# Patient Record
Sex: Female | Born: 1945 | Race: White | Hispanic: No
Health system: Midwestern US, Community
[De-identification: ages and names within clinical notes are randomized; demographics above are authoritative.]

---

## 2012-03-30 ENCOUNTER — Other Ambulatory Visit: Payer: Self-pay | Admitting: Neurosurgery

## 2012-03-30 DIAGNOSIS — M545 Low back pain: Secondary | ICD-10-CM

## 2012-03-30 DIAGNOSIS — M47817 Spondylosis without myelopathy or radiculopathy, lumbosacral region: Secondary | ICD-10-CM

## 2012-03-30 DIAGNOSIS — M4310 Spondylolisthesis, site unspecified: Secondary | ICD-10-CM

## 2012-04-04 ENCOUNTER — Ambulatory Visit
Admission: RE | Admit: 2012-04-04 | Discharge: 2012-04-04 | Disposition: A | Discharge status: Home / Self Care | Payer: Medicare Other | Source: Ambulatory Visit | Attending: Neurosurgery | Admitting: Neurosurgery

## 2012-04-04 DIAGNOSIS — M47817 Spondylosis without myelopathy or radiculopathy, lumbosacral region: Secondary | ICD-10-CM

## 2012-04-04 DIAGNOSIS — M4310 Spondylolisthesis, site unspecified: Secondary | ICD-10-CM

## 2012-04-04 DIAGNOSIS — M545 Low back pain: Secondary | ICD-10-CM

## 2012-04-20 ENCOUNTER — Other Ambulatory Visit: Payer: Self-pay

## 2017-08-28 ENCOUNTER — Other Ambulatory Visit: Payer: Self-pay | Admitting: Internal Medicine

## 2017-08-28 DIAGNOSIS — M25551 Pain in right hip: Secondary | ICD-10-CM

## 2017-08-28 DIAGNOSIS — R740 Nonspecific elevation of levels of transaminase and lactic acid dehydrogenase [LDH]: Principal | ICD-10-CM

## 2017-08-28 DIAGNOSIS — R7401 Elevation of levels of liver transaminase levels: Secondary | ICD-10-CM

## 2017-09-03 ENCOUNTER — Ambulatory Visit
Admission: RE | Admit: 2017-09-03 | Discharge: 2017-09-03 | Disposition: A | Discharge status: Home / Self Care | Payer: Medicare Other | Source: Ambulatory Visit | Attending: Internal Medicine | Admitting: Internal Medicine

## 2017-09-03 DIAGNOSIS — R7401 Elevation of levels of liver transaminase levels: Secondary | ICD-10-CM

## 2017-09-03 DIAGNOSIS — M25551 Pain in right hip: Secondary | ICD-10-CM

## 2017-09-03 DIAGNOSIS — R740 Nonspecific elevation of levels of transaminase and lactic acid dehydrogenase [LDH]: Principal | ICD-10-CM

## 2017-09-03 MED ORDER — IOPAMIDOL (ISOVUE-300) INJECTION 61%
100.0000 mL | Freq: Once | INTRAVENOUS | Status: AC | PRN
  Administered 2017-09-03: 100 mL via INTRAVENOUS

## 2017-09-04 ENCOUNTER — Other Ambulatory Visit: Payer: Medicare Other

## 2018-12-22 ENCOUNTER — Other Ambulatory Visit: Payer: Self-pay | Admitting: Internal Medicine

## 2018-12-22 DIAGNOSIS — E785 Hyperlipidemia, unspecified: Secondary | ICD-10-CM

## 2018-12-22 DIAGNOSIS — I7090 Unspecified atherosclerosis: Secondary | ICD-10-CM

## 2018-12-29 ENCOUNTER — Ambulatory Visit
Admission: RE | Admit: 2018-12-29 | Discharge: 2018-12-29 | Disposition: A | Discharge status: Home / Self Care | Payer: No Typology Code available for payment source | Source: Ambulatory Visit | Attending: Internal Medicine | Admitting: Internal Medicine

## 2018-12-29 DIAGNOSIS — I7090 Unspecified atherosclerosis: Secondary | ICD-10-CM

## 2018-12-29 DIAGNOSIS — E785 Hyperlipidemia, unspecified: Secondary | ICD-10-CM

## 2019-06-06 ENCOUNTER — Ambulatory Visit: Payer: Self-pay | Attending: Internal Medicine

## 2019-06-06 DIAGNOSIS — Z23 Encounter for immunization: Secondary | ICD-10-CM | POA: Insufficient documentation

## 2019-06-06 NOTE — Progress Notes (Signed)
   Covid-19 Vaccination Clinic  Name:  Danielle Page    MRN: [id number] DOB: [birth year]  06/06/2019  Danielle Page was observed post Covid-19 immunization for 15 minutes without incidence. She was provided with Vaccine Information Sheet and instruction to access the V-Safe system.   Danielle Page was instructed to call 911 with any severe reactions post vaccine: Marland Kitchen Difficulty breathing  . Swelling of your face and throat  . A fast heartbeat  . A bad rash all over your body  . Dizziness and weakness    Immunizations Administered    Name Date Dose VIS Date Route   Pfizer COVID-19 Vaccine 06/06/2019 10:08 AM 0.3 mL 04/01/2019 Intramuscular   Manufacturer: [employer], [employer]   Lot: [id number]   NDC: [address]-6

## 2019-06-13 ENCOUNTER — Ambulatory Visit: Payer: Medicare Other

## 2020-04-03 DIAGNOSIS — D45 Polycythemia vera: Secondary | ICD-10-CM | POA: Diagnosis not present

## 2020-04-03 DIAGNOSIS — R5381 Other malaise: Secondary | ICD-10-CM | POA: Diagnosis not present

## 2020-04-25 DIAGNOSIS — R5381 Other malaise: Secondary | ICD-10-CM | POA: Diagnosis not present

## 2020-04-25 DIAGNOSIS — D45 Polycythemia vera: Secondary | ICD-10-CM | POA: Diagnosis not present

## 2020-04-25 DIAGNOSIS — R5383 Other fatigue: Secondary | ICD-10-CM | POA: Diagnosis not present

## 2020-04-25 DIAGNOSIS — E559 Vitamin D deficiency, unspecified: Secondary | ICD-10-CM | POA: Diagnosis not present

## 2020-04-25 DIAGNOSIS — R531 Weakness: Secondary | ICD-10-CM | POA: Diagnosis not present

## 2020-05-29 DIAGNOSIS — R6881 Early satiety: Secondary | ICD-10-CM | POA: Diagnosis not present

## 2020-05-29 DIAGNOSIS — R634 Abnormal weight loss: Secondary | ICD-10-CM | POA: Diagnosis not present

## 2020-05-29 DIAGNOSIS — R439 Unspecified disturbances of smell and taste: Secondary | ICD-10-CM | POA: Diagnosis not present

## 2020-05-29 DIAGNOSIS — D45 Polycythemia vera: Secondary | ICD-10-CM | POA: Diagnosis not present

## 2020-05-30 ENCOUNTER — Other Ambulatory Visit (HOSPITAL_BASED_OUTPATIENT_CLINIC_OR_DEPARTMENT_OTHER): Payer: Self-pay | Admitting: Internal Medicine

## 2020-05-30 DIAGNOSIS — M65331 Trigger finger, right middle finger: Secondary | ICD-10-CM | POA: Diagnosis not present

## 2020-05-30 DIAGNOSIS — R634 Abnormal weight loss: Secondary | ICD-10-CM

## 2020-05-31 ENCOUNTER — Other Ambulatory Visit: Payer: Self-pay

## 2020-05-31 ENCOUNTER — Encounter (HOSPITAL_BASED_OUTPATIENT_CLINIC_OR_DEPARTMENT_OTHER): Payer: Self-pay

## 2020-05-31 ENCOUNTER — Ambulatory Visit (HOSPITAL_BASED_OUTPATIENT_CLINIC_OR_DEPARTMENT_OTHER)
Admission: RE | Admit: 2020-05-31 | Discharge: 2020-05-31 | Disposition: A | Discharge status: Home / Self Care | Payer: Medicare PPO | Source: Ambulatory Visit | Attending: Internal Medicine | Admitting: Internal Medicine

## 2020-05-31 DIAGNOSIS — C50911 Malignant neoplasm of unspecified site of right female breast: Secondary | ICD-10-CM | POA: Diagnosis not present

## 2020-05-31 DIAGNOSIS — K449 Diaphragmatic hernia without obstruction or gangrene: Secondary | ICD-10-CM | POA: Diagnosis not present

## 2020-05-31 DIAGNOSIS — M4316 Spondylolisthesis, lumbar region: Secondary | ICD-10-CM | POA: Diagnosis not present

## 2020-05-31 DIAGNOSIS — N8110 Cystocele, unspecified: Secondary | ICD-10-CM | POA: Diagnosis not present

## 2020-05-31 DIAGNOSIS — I700 Atherosclerosis of aorta: Secondary | ICD-10-CM | POA: Diagnosis not present

## 2020-05-31 DIAGNOSIS — R634 Abnormal weight loss: Secondary | ICD-10-CM | POA: Insufficient documentation

## 2020-05-31 DIAGNOSIS — E785 Hyperlipidemia, unspecified: Secondary | ICD-10-CM | POA: Diagnosis not present

## 2020-05-31 MED ORDER — IOHEXOL 300 MG/ML  SOLN
100.0000 mL | Freq: Once | INTRAMUSCULAR | Status: AC | PRN
  Administered 2020-05-31: 100 mL via INTRAVENOUS

## 2020-06-01 DIAGNOSIS — H353124 Nonexudative age-related macular degeneration, left eye, advanced atrophic with subfoveal involvement: Secondary | ICD-10-CM | POA: Diagnosis not present

## 2020-06-01 DIAGNOSIS — H353113 Nonexudative age-related macular degeneration, right eye, advanced atrophic without subfoveal involvement: Secondary | ICD-10-CM | POA: Diagnosis not present

## 2020-06-01 DIAGNOSIS — H269 Unspecified cataract: Secondary | ICD-10-CM | POA: Diagnosis not present

## 2020-06-01 DIAGNOSIS — H353221 Exudative age-related macular degeneration, left eye, with active choroidal neovascularization: Secondary | ICD-10-CM | POA: Diagnosis not present

## 2020-06-04 DIAGNOSIS — E876 Hypokalemia: Secondary | ICD-10-CM | POA: Diagnosis not present

## 2020-06-04 DIAGNOSIS — D61818 Other pancytopenia: Secondary | ICD-10-CM | POA: Diagnosis not present

## 2020-06-04 DIAGNOSIS — Z853 Personal history of malignant neoplasm of breast: Secondary | ICD-10-CM | POA: Diagnosis not present

## 2020-06-04 DIAGNOSIS — J849 Interstitial pulmonary disease, unspecified: Secondary | ICD-10-CM | POA: Diagnosis not present

## 2020-06-04 DIAGNOSIS — D6182 Myelophthisis: Secondary | ICD-10-CM | POA: Diagnosis not present

## 2020-06-04 DIAGNOSIS — L03114 Cellulitis of left upper limb: Secondary | ICD-10-CM | POA: Diagnosis not present

## 2020-06-04 DIAGNOSIS — D849 Immunodeficiency, unspecified: Secondary | ICD-10-CM | POA: Diagnosis not present

## 2020-06-04 DIAGNOSIS — I8290 Acute embolism and thrombosis of unspecified vein: Secondary | ICD-10-CM | POA: Diagnosis not present

## 2020-06-04 DIAGNOSIS — R21 Rash and other nonspecific skin eruption: Secondary | ICD-10-CM | POA: Diagnosis not present

## 2020-06-04 DIAGNOSIS — D709 Neutropenia, unspecified: Secondary | ICD-10-CM | POA: Diagnosis not present

## 2020-06-04 DIAGNOSIS — R918 Other nonspecific abnormal finding of lung field: Secondary | ICD-10-CM | POA: Diagnosis not present

## 2020-06-04 DIAGNOSIS — D696 Thrombocytopenia, unspecified: Secondary | ICD-10-CM | POA: Diagnosis not present

## 2020-06-04 DIAGNOSIS — Z8673 Personal history of transient ischemic attack (TIA), and cerebral infarction without residual deficits: Secondary | ICD-10-CM | POA: Diagnosis not present

## 2020-06-04 DIAGNOSIS — M79602 Pain in left arm: Secondary | ICD-10-CM | POA: Diagnosis not present

## 2020-06-04 DIAGNOSIS — M7989 Other specified soft tissue disorders: Secondary | ICD-10-CM | POA: Diagnosis not present

## 2020-06-04 DIAGNOSIS — E883 Tumor lysis syndrome: Secondary | ICD-10-CM | POA: Diagnosis not present

## 2020-06-04 DIAGNOSIS — B259 Cytomegaloviral disease, unspecified: Secondary | ICD-10-CM | POA: Diagnosis not present

## 2020-06-04 DIAGNOSIS — I82612 Acute embolism and thrombosis of superficial veins of left upper extremity: Secondary | ICD-10-CM | POA: Diagnosis not present

## 2020-06-04 DIAGNOSIS — C92A0 Acute myeloid leukemia with multilineage dysplasia, not having achieved remission: Secondary | ICD-10-CM | POA: Diagnosis not present

## 2020-06-04 DIAGNOSIS — D649 Anemia, unspecified: Secondary | ICD-10-CM | POA: Diagnosis not present

## 2020-06-04 DIAGNOSIS — B49 Unspecified mycosis: Secondary | ICD-10-CM | POA: Diagnosis not present

## 2020-06-04 DIAGNOSIS — C9440 Acute panmyelosis with myelofibrosis not having achieved remission: Secondary | ICD-10-CM | POA: Diagnosis not present

## 2020-06-04 DIAGNOSIS — I82A19 Acute embolism and thrombosis of unspecified axillary vein: Secondary | ICD-10-CM | POA: Diagnosis not present

## 2020-06-04 DIAGNOSIS — R5081 Fever presenting with conditions classified elsewhere: Secondary | ICD-10-CM | POA: Diagnosis not present

## 2020-06-04 DIAGNOSIS — M79604 Pain in right leg: Secondary | ICD-10-CM | POA: Diagnosis not present

## 2020-06-04 DIAGNOSIS — D45 Polycythemia vera: Secondary | ICD-10-CM | POA: Diagnosis not present

## 2020-06-04 DIAGNOSIS — D6869 Other thrombophilia: Secondary | ICD-10-CM | POA: Diagnosis not present

## 2020-06-04 DIAGNOSIS — D61810 Antineoplastic chemotherapy induced pancytopenia: Secondary | ICD-10-CM | POA: Diagnosis not present

## 2020-06-04 DIAGNOSIS — R509 Fever, unspecified: Secondary | ICD-10-CM | POA: Diagnosis not present

## 2020-06-04 DIAGNOSIS — Z856 Personal history of leukemia: Secondary | ICD-10-CM | POA: Diagnosis not present

## 2020-06-04 DIAGNOSIS — J9811 Atelectasis: Secondary | ICD-10-CM | POA: Diagnosis not present

## 2020-06-04 DIAGNOSIS — T82868A Thrombosis of vascular prosthetic devices, implants and grafts, initial encounter: Secondary | ICD-10-CM | POA: Diagnosis not present

## 2020-06-04 DIAGNOSIS — L270 Generalized skin eruption due to drugs and medicaments taken internally: Secondary | ICD-10-CM | POA: Diagnosis not present

## 2020-06-04 DIAGNOSIS — G40909 Epilepsy, unspecified, not intractable, without status epilepticus: Secondary | ICD-10-CM | POA: Diagnosis not present

## 2020-06-04 DIAGNOSIS — C9200 Acute myeloblastic leukemia, not having achieved remission: Secondary | ICD-10-CM | POA: Diagnosis not present

## 2020-06-04 DIAGNOSIS — R600 Localized edema: Secondary | ICD-10-CM | POA: Diagnosis not present

## 2020-06-04 LAB — COVID-19: SARS-CoV-2: NOT DETECTED

## 2020-06-04 NOTE — H&P (Signed)
History and Physical               [facility]  Lauren Madden., MD  Admission Date: 06/04/2020    ADMISSION DIAGNOSES:  1.  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  2.  History of polycythemia vera.  3.  Pancytopenia.  4.  Low-grade fever.    HISTORY OF PRESENT ILLNESS:  This is a 75 year old woman who was  evaluated in and admitted directly from the office today  for further management of suspected acute leukemia.  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  She has been maintained on  hydroxyurea since that time with excellent disease control.  They  spend half of the year in [place].  Last week routine followup  CBC obtained locally by her primary care physician revealed  thrombocytopenia.  As this was not anticipated, an immediate redraw  was recommended and confirmed thrombocytopenia at 66,000.  We  recommended that the patient seek a hematologist locally in [place] for fear that she had developed a new hematological process.   However, the patient related that her primary care physician did not  feel that there was a competent hematologist nearby and recommended  that she return to [place].  She therefore was seen by me in the  office today.  She relates that over the past 3 weeks she has had  increasing generalized weakness and fatigue.  She has had loss of  appetite and has lost some 10 pounds over the past 3 months.  She  transiently lost her taste for some foods, but this has since  recovered.  She was seen by her primary care physician in [place].  A COVID test was performed and was negative.  CT scans of  the chest, abdomen and pelvis were performed at [place], [place] and were reportedly normal including the absence of  splenomegaly.  She relates a low-grade fever over the past 3 days to 100  degrees.  She has had mild sweats, but no drenching sweats over the  past 7 days with occasional  chills.  She has had watery stools once or  twice a day off and on for the past few weeks, but worse over the past  few days.  Of note, she has been taking metaxalone over the past 3  months, which has a notable risk of hemolytic anemia and leukopenia.   She was instructed to discontinue Hydrea immediately upon notification  of the thrombocytopenia.  At that time she also discontinued the 2  baby aspirin that she took each day.  She related no melena or  hematochezia.    Review of the peripheral blood smear is very worrisome, revealing  a number of atypical large mononuclear cells with nucleoli and scant  amounts of deep blue cytoplasm consistent with blasts.  No Auer rods are seen.  Occasional  nucleated red blood cells are evident.  Given the leucoerythroblastic  picture on the peripheral blood smear, a bone marrow aspirate and  biopsy were performed in the office prior to admission for further  investigation and treatment.    PRIOR MEDICAL HISTORY:  Otherwise noteworthy for pernicious anemia,  seizure disorder, vitamin D deficiency, stroke as above, presentation  with P. vera in 2010, history of pulmonary embolus, complicated breast  surgery in 2009.  She had very early stage right breast cancer excised  in 2009 via  lumpectomy and treated with MammoSite radiation without  evidence of recurrence.    PAST SURGICAL HISTORY:  Includes parathyroidectomy in 2006, history of  breast biopsy in 2003 revealing benign changes.  She underwent  oophorectomy, rectocele and cystocele repair in 1995.    ADMISSION MEDICATIONS:  Include hydroxyurea, on hold as of last week.   Dilantin 100 mg t.i.d., potassium 10 mEq daily, B12 1000 mcg IM  monthly, Dexilant 60 mg daily, VESIcare 5 mg daily, vitamin D3 50,000  units weekly and metaxalone 800 mg daily.    ALLERGIES:  IV DYE, SHELLFISH, WASP VENOM, HONEY BEE VENOM.    SOCIAL HISTORY:  She is married.  She is a retired [employer].  She is a never smoker.  She does not drink  alcohol.    FAMILY HISTORY:  Noncontributory.    REVIEW OF SYSTEMS:  As above.    PHYSICAL EXAMINATION:  She is alert.  Temperature is 100.2 degrees in  the office.  Pulse 100, blood pressure 101/71.  GENERAL:  She appears  fatigued and moderately ill.  She is alert and lucid.  HEENT:  Reveals  no icterus.  There is moderate pallor.  There are no focal oral  lesions.  There is no gingival margin abnormality.  NECK:  Supple.   There is no palpable peripheral lymphadenopathy.  CHEST:  Lung fields  are clear.  CARDIAC:  Regular, without murmur, rub or gallop.   ABDOMEN:  Nondistended, soft and nontender.  I cannot appreciate  organomegaly.  There is no evidence of ascites.  EXTREMITIES:  There  is no dependent edema.  SKIN:  No skin rash is noted.  She is  generally weak, but can mount the exam table with limited assistance.    LABORATORY DATA:  White count of 6200, hemoglobin is 8.1, platelet  count 49,000.  Of interest, the hemoglobin on January 5th was 13.4, at  which time the platelet count was 246,000.  Chemistries reveal sodium  132, BUN 17, creatinine 0.6, SGOT 53, SGPT 26, LDH is 4953.   Reticulocytopenia is evident at 37,000.  Peripheral blood smear review  as above.    IMPRESSION:  A 75 year old with longstanding history of polycythemia  vera well controlled on relatively low-dose hydroxyurea presents with  a recent onset of thrombocytopenia, anemia and a multitude of constitutional  symptoms of concern.    Review of the peripheral blood smear is very worrisome for the  development of acute leukemia.  No Auer rods are seen and thus it is  unclear whether this represents a myeloid or lymphoid histology.  A  bone marrow aspiration and biopsy was originally performed in the  office.  She is admitted for further investigation and treatment.  I  anticipate the need for induction chemotherapy.  Prognosis is quite  guarded.  The situation was discussed in detail with the patient and  her husband.      Phineas Douglas,  Montez Hageman., MD  TR: dn DD: 06/04/2020 20:02 TD: 06/04/2020 20:34 Job#: [id number]  \\[id number]\\DOC#: [id number]  \\[id number]\\    Signature Line     Electronically Signed on 06/05/2020 07:29 AM EST   ________________________________________________   Lauren Madden FREDERICK-MD               Modified by: Lauren Madden FREDERICK-MD on 06/05/2020 07:29 AM EST

## 2020-06-04 NOTE — Nursing Note (Signed)
Adult Patient History Form-Text       Adult Patient History Entered On:  06/04/2020 15:50 EST    Performed On:  06/04/2020 15:27 EST by PLUKAS, RN, JESSICA               General Info   Patient Identified :   Identification band, Verbal   Information Given By :   Self, Spouse   Accompanied By :   Spouse   In Charge of News (ICON) Name :   Lauren Madden   Pregnancy Status :   N/A   Has the patient received chemotherapy or immunotherapy (cytotoxic)  in the last 48-72 hours? :   No   In Clinical Trial With Signed Consent for Related Condition :   N/A   Is the patient currently (2-3 days) receiving radiation treatment? :   No   PLUKAS, RN, JESSICA - 06/04/2020 15:27 EST   Allergies   (As Of: 06/04/2020 15:50:34 EST)   Allergies (Active)   Betadine  Estimated Onset Date:   Unspecified ; Reactions:   Swelling ; Created By:   Cassell Clement, RN, JESSICA; Reaction Status:   Active ; Category:   Drug ; Substance:   Betadine ; Type:   Allergy ; Severity:   Severe ; Updated By:   Cassell Clement, RN, JESSICA; Reviewed Date:   06/04/2020 15:30 EST      Seafood  Estimated Onset Date:   Unspecified ; Reactions:   Swelling ; Created By:   Cassell Clement, RN, JESSICA; Reaction Status:   Active ; Category:   Drug ; Substance:   Seafood ; Type:   Allergy ; Severity:   Severe ; Updated By:   Cassell Clement, RN, JESSICA; Reviewed Date:   06/04/2020 15:31 EST        Medication History   Medication List   (As Of: 06/04/2020 15:50:34 EST)   Home Meds    albuterol  :   albuterol ; Status:   Documented ; Ordered As Mnemonic:   albuterol ; Simple Display Line:   4 mg, NEB, q6hr, PRN: shortness of breath/wheezing, 0 Refill(s) ; Catalog Code:   albuterol ; Order Dt/Tm:   06/04/2020 15:46:06 EST          ascorbic acid  :   ascorbic acid ; Status:   Documented ; Ordered As Mnemonic:   Ester-C 1000 mg oral tablet ; Simple Display Line:   1,000 mg, 1 tabs, Oral, Daily, 30 tabs, 0 Refill(s) ; Catalog Code:   ascorbic acid ; Order Dt/Tm:   06/04/2020 15:45:10 EST          cyanocobalamin  :    cyanocobalamin ; Status:   Documented ; Ordered As Mnemonic:   Vitamin B-12 (cyanocobalamin) 1000 mcg/mL injectable solution ; Simple Display Line:   1,000 mcg, 1 mL, IM, qMonth, 10 mL, 0 Refill(s) ; Catalog Code:   cyanocobalamin ; Order Dt/Tm:   06/04/2020 15:47:59 EST          dexlansoprazole  :   dexlansoprazole ; Status:   Documented ; Ordered As Mnemonic:   Dexilant 60 mg oral delayed release capsule ; Simple Display Line:   60 mg, 1 caps, Oral, Daily, 30 caps, 0 Refill(s) ; Catalog Code:   dexlansoprazole ; Order Dt/Tm:   06/04/2020 15:47:59 EST          ergocalciferol  :   ergocalciferol ; Status:   Documented ; Ordered As Mnemonic:   ergocalciferol 50,000 intl units (1.25 mg) oral capsule ; Simple  Display Line:   1 caps, Oral, qWeek, 24 caps, 0 Refill(s) ; Catalog Code:   ergocalciferol ; Order Dt/Tm:   06/04/2020 15:47:59 EST          phenyTOIN  :   phenyTOIN ; Status:   Documented ; Ordered As Mnemonic:   Dilantin 100 mg oral capsule, extended release ; Simple Display Line:   200 mg, 2 caps, Oral, Once a day (at bedtime), 270 cap, 0 Refill(s) ; Catalog Code:   phenyTOIN ; Order Dt/Tm:   06/04/2020 15:47:59 EST          solifenacin  :   solifenacin ; Status:   Documented ; Ordered As Mnemonic:   solifenacin 5 mg oral tablet ; Simple Display Line:   5 mg, 1 tabs, Oral, Daily, 30 tabs, 0 Refill(s) ; Catalog Code:   solifenacin ; Order Dt/Tm:   06/04/2020 15:47:59 EST            Problem History   (As Of: 06/04/2020 15:50:34 EST)   Problems(Active)    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 )  Name of Problem:   Absent parathyroid gland ; Recorder:   PLUKAS, RN, JESSICA; Confirmation:   Confirmed ; Classification:   Patient Stated ; Code:   979892119 ; Contributor System:   Dietitian ; Last Updated:   06/04/2020 15:38 EST ; Life Cycle Date:   06/04/2020 ; Life Cycle Status:   Active ; Vocabulary:   SNOMED CT        Acid reflux (SNOMED CT  :417408144 )  Name of Problem:   Acid reflux ; Recorder:   PLUKAS, RN,  JESSICA; Confirmation:   Confirmed ; Classification:   Patient Stated ; Code:   818563149 ; Contributor System:   PowerChart ; Last Updated:   06/04/2020 15:38 EST ; Life Cycle Date:   06/04/2020 ; Life Cycle Status:   Active ; Vocabulary:   SNOMED CT        Acute cerebrovascular accident (CVA) (SNOMED CT  :702637858 )  Name of Problem:   Acute cerebrovascular accident (CVA) ; Recorder:   PLUKAS, RN, JESSICA; Confirmation:   Confirmed ; Classification:   Patient Stated ; Code:   850277412 ; Contributor System:   Dietitian ; Last Updated:   06/04/2020 15:32 EST ; Life Cycle Date:   06/04/2020 ; Life Cycle Status:   Active ; Vocabulary:   SNOMED CT        Breast CA (SNOMED CT  :878676720 )  Name of Problem:   Breast CA ; Recorder:   PLUKAS, RN, JESSICA; Confirmation:   Confirmed ; Classification:   Patient Stated ; Code:   947096283 ; Contributor System:   PowerChart ; Last Updated:   06/04/2020 15:34 EST ; Life Cycle Date:   06/04/2020 ; Life Cycle Status:   Active ; Vocabulary:   SNOMED CT        Polycythemia vera (SNOMED CT  :662947654 )  Name of Problem:   Polycythemia vera ; Recorder:   PLUKAS, RN, JESSICA; Confirmation:   Confirmed ; Classification:   Patient Stated ; Code:   650354656 ; Contributor System:   PowerChart ; Last Updated:   06/04/2020 15:32 EST ; Life Cycle Date:   06/04/2020 ; Life Cycle Status:   Active ; Vocabulary:   SNOMED CT        Pulmonary embolism (SNOMED CT  :81275170 )  Name of Problem:   Pulmonary embolism ; Recorder:   PLUKAS, RN, JESSICA; Confirmation:  Confirmed ; Classification:   Patient Stated ; Code:   [id number] ; Contributor System:   PowerChart ; Last Updated:   06/04/2020 15:36 EST ; Life Cycle Date:   06/04/2020 ; Life Cycle Status:   Active ; Vocabulary:   SNOMED CT        Shingles (SNOMED CT  :62130868902018 )  Name of Problem:   Shingles ; Recorder:   PLUKAS, RN, JESSICA; Confirmation:   Confirmed ; Classification:   Patient Stated ; Code:   5784696:   8902018 ; Contributor System:   DietitianowerChart ;  Last Updated:   06/04/2020 15:37 EST ; Life Cycle Date:   06/04/2020 ; Life Cycle Status:   Active ; Vocabulary:   SNOMED CT          Procedure History        -    Procedure History   (As Of: 06/04/2020 15:50:34 EST)     Anesthesia Minutes:   0 ; Procedure Name:   Lumpectomy of breast ; Procedure Minutes:   0 ; Last Reviewed Dt/Tm:   06/04/2020 15:50:32 EST            Anesthesia Minutes:   0 ; Procedure Name:   Hysterectomy ; Procedure Minutes:   0 ; Last Reviewed Dt/Tm:   06/04/2020 15:50:32 EST            Immunizations   Influenza Vaccine Status :   Received prior to admission, during current flu season   PLUKAS, RN, JESSICA - 06/04/2020 15:27 EST   ID Risk Screen Symptoms   Recent Travel History :   No recent travel   Close Contact with COVID-19 ID :   No   Last 14 days COVID-19 ID :   Yes - Not Detected (negative)   TB Symptom Screen :   No symptoms   C. diff Symptom/History ID :   Neither of the above   MRSA/VRE Screening :   Admitted to an Critical Care or BMT   PLUKAS, RN, JESSICA - 06/04/2020 15:27 EST   Bloodless Medicine   Is Blood Transfusion Acceptable to Patient :   Yes   PLUKAS, RN, JESSICA - 06/04/2020 15:27 EST   Nutrition   Nutrition Screen for Malnutrition :   Decrease in appetite   PLUKAS, RN, JESSICA - 06/04/2020 15:27 EST   Malnutrition Screening Tool   MST Have You Recently Lost Weight Without Trying? :   No   MST Weight Loss Score :   0    MST Have You Been Eating Poorly? :   Yes   MST Appetite Score :   1    MST Score :   1    MST Interpretation :   Not at risk   PLUKAS, RN, JESSICA - 06/04/2020 15:27 EST   Functional   Sensory Deficits :   Other: macular degeneration   ADLs Prior to Admission :   Independent   PLUKAS, RN, JESSICA - 06/04/2020 15:27 EST   Social History   Social History   (As Of: 06/04/2020 15:50:34 EST)   Tobacco:        Tobacco use: Never (less than 100 in lifetime).   (Last Updated: 06/04/2020 15:41:52 EST by Cassell ClementPLUKAS, RN, JESSICA)          Alcohol:        Denies   (Last Updated:  06/04/2020 15:41:52 EST by Cassell ClementPLUKAS, RN, JESSICA)          Substance Use:  Opioid Naive - not currently taking opioids   (Last Updated: 06/04/2020 15:41:52 EST by Cassell Clement, RN, JESSICA)            Spiritual   Do you have a concern that you would like to address with a Chaplain? :   No   Do you have any religious/spiritual/cultural beliefs that could impact the way your care is provided? :   No   PLUKAS, RN, JESSICA - 06/04/2020 15:27 EST   Harm Screen   Suspect or Concern for: :   None   Feels Safe Where Live :   Yes   Last 3 mo, thoughts killing self/others :   Patient denies   PLUKAS, RN, JESSICA - 06/04/2020 15:27 EST   Advance Directive   Location of Advance Directive :   Family to bring in copy from home   Advance Directive :   Yes   Type of Advance Directive :   Living will, Medical durable power of attorney   Patient Wishes to Receive Further Information on Advance Directives :   No   PLUKAS, RN, JESSICA - 06/04/2020 15:27 EST   Education   Caregiver/Advocate Language   Patient :   None, Demonstration   Family :   Demonstration, None   PLUKAS, RN, JESSICA - 06/04/2020 15:27 EST   Teaching Method :   Demonstration, Explanation   PLUKAS, RN, JESSICA - 06/04/2020 15:27 EST   Preventative Measures Information   Unit/Room Orientation :   Verbalizes understanding, Demonstrates   Environmental Safety :   Verbalizes understanding, Demonstrates   Hand Washing :   Verbalizes understanding, Demonstrates   Infection Prevention :   Verbalizes understanding, Demonstrates   DVT Prophylaxis :   Verbalizes understanding, Demonstrates   Isolation Precaution :   Verbalizes understanding, Demonstrates   PLUKAS, RN, JESSICA - 06/04/2020 15:27 EST   DC Needs   CM Living Situation :   Home with no services   Home Caregiver Name/Relationship :   Lauren Madden   Current Living Situation :   [phone]   Anticipated Discharge Needs :   Home health   Cassell Clement, RN, JESSICA - 06/04/2020 15:27 EST   Valuables and Belongings   Does Patient Have Valuables  and Belongings :   Yes   PLUKAS, RN, JESSICA - 06/04/2020 15:27 EST   Valuables and Belongings   At Bedside :   Cell phone   PLUKAS, RN, JESSICA - 06/04/2020 15:27 EST   Admission Complete   Admission Complete :   Yes   PLUKAS, RN, JESSICA - 06/04/2020 15:27 EST

## 2020-06-05 LAB — DIFFERENTIAL, MANUAL
Absolute Bands #: 0.1 10*3/uL
Absolute Lymph #: 1 10*3/uL (ref 1.0–3.2)
Absolute Mono #: 0.6 10*3/uL (ref 0.3–1.0)
Bands Relative: 2 % (ref 0–5)
Eosinophils %: 1 % (ref 0–7)
Lymphocytes: 21 % (ref 15–45)
Lymphs, Absolute Count, Reactive: 0.6 10*3/uL
Metamyelocytes Absolute: 0.1 10*3/uL
Metamyelocytes Relative: 2 %
Monocytes: 12 % (ref 4–12)
Myelocyte Percent: 1 %
Neutrophils %. Manual count: 48 % (ref 42–74)
Neutrophils Absolute: 2.3 10*3/uL (ref 1.6–7.3)
Nucleated RBCs: 2 100WBC — ABNORMAL HIGH (ref 0–0)
Platelet Estimate: DECREASED — AB
RBC Morphology: ABNORMAL — AB
Reactive Lymphocytes: 13 %

## 2020-06-05 LAB — PROTIME-INR
INR: 1.4 — ABNORMAL LOW (ref 1.5–3.5)
Protime: 17.4 seconds — ABNORMAL HIGH (ref 11.6–14.5)

## 2020-06-05 LAB — COMPREHENSIVE METABOLIC PANEL
ALT: 17 U/L (ref 0–33)
AST: 28 U/L (ref 0–32)
Albumin/Globulin Ratio: 1.5 mmol/L (ref 1.00–2.70)
Albumin: 3.4 g/dL — ABNORMAL LOW (ref 3.5–5.2)
Alk Phosphatase: 73 U/L (ref 35–117)
Anion Gap: 12 mmol/L (ref 2–17)
BUN: 10 mg/dL (ref 8–23)
CALCIUM,CORRECTED,CCA: 8.9 mg/dL (ref 8.8–10.2)
CO2: 22 mmol/L (ref 22–29)
Calcium: 8.4 mg/dL — ABNORMAL LOW (ref 8.8–10.2)
Chloride: 100 mmol/L (ref 98–107)
Creatinine: 0.5 mg/dL (ref 0.5–1.0)
GFR African American: 110 mL/min/{1.73_m2} (ref >=90)
GFR Non-African American: 95 mL/min/{1.73_m2} (ref >=90)
Globulin: 2.3 g/dL (ref 1.9–4.4)
Glucose: 109 mg/dL — ABNORMAL HIGH (ref 70–99)
OSMOLALITY CALCULATED: 268 mOsm/kg — ABNORMAL LOW (ref 270–287)
Potassium: 3.4 mmol/L — ABNORMAL LOW (ref 3.5–5.3)
Sodium: 134 mmol/L — ABNORMAL LOW (ref 135–145)
Total Bilirubin: 0.69 mg/dL (ref 0.00–1.20)
Total Protein: 5.7 g/dL — ABNORMAL LOW (ref 6.4–8.3)

## 2020-06-05 LAB — CBC WITH AUTO DIFFERENTIAL
Hematocrit: 22.2 % — ABNORMAL LOW (ref 34.0–47.0)
Hemoglobin: 7.6 g/dL — ABNORMAL LOW (ref 11.5–15.7)
MCH: 40.2 pg — ABNORMAL HIGH (ref 27.0–34.5)
MCHC: 34.2 g/dL (ref 32.0–36.0)
MCV: 117.5 fL — ABNORMAL HIGH (ref 81.0–99.0)
MPV: 11.3 fL (ref 7.2–13.2)
NRBC Absolute: 0.21 10*3/uL — ABNORMAL HIGH (ref 0.000–0.012)
NRBC Automated: 4.5 % — ABNORMAL HIGH (ref 0.0–0.2)
Platelets: 44 10*3/uL — ABNORMAL LOW (ref 140–440)
RBC: 1.89 x10e6/mcL — ABNORMAL LOW (ref 3.60–5.20)
RDW: 14.1 % (ref 11.0–16.0)
WBC: 4.7 10*3/uL (ref 3.8–10.6)

## 2020-06-05 LAB — IMMATURE CELLS
IMMATURE PLT ABSOLUTE: 7.4 10*3/uL
IMMATURE PLT PERCENT: 16.8 % — ABNORMAL HIGH (ref 1.2–8.6)

## 2020-06-05 LAB — HBSAG: Hepatitis B Surface Ag: NONREACTIVE

## 2020-06-05 LAB — PTT: PTT: 29 seconds (ref 23.3–34.5)

## 2020-06-05 LAB — URIC ACID: Uric Acid: 4.2 mg/dL (ref 2.4–5.7)

## 2020-06-05 NOTE — Nursing Note (Signed)
Vascular Access Nurse Note               ULTRASOUND GUIDED PICC LINE PLACEMENT    INDICATION: CHEMO    PROCEDURE PERFORMED BY:  Lavon Paganini, RN    PROCEDURE:    After informed, written consent, A preprocedure timeout was performed.  Arm circumference at insertion site was 25cm.  The patient's LEFT arm was prepped and draped in sterile fashion.   Local anesthesia was obtained with 58mL of 1% lidocaine.  Using ultrasound guidance, the LEFT BASILIC vein was accessed using micropuncture technique x1 attempt.  A guide wire was introduced into the needle.  The needle was removed, a 6 French peel-away sheath was placed over the wire and then the wire was removed.  A 42 cm, TRIPLE lumen catheter (ARROW lot# [id number]) was advanced through the peel-away sheath and the sheath was removed.   The tip of the catheter terminates in the SVC/CAJ per vascular positioning system, utilizing the Teleflex guide wire.   The catheter functions well and is ready for use. A biopatch was placed around the catheter and was secured to the skin with a stat lock.   A sterile dressing was applied.  The catheter was flushed with 8mL of sterile normal saline.  Patient tolerated procedure well.  No Complications.    PROCEDURE START TIME: 0850  PROCEDURE END TIME:  0910    Signature Line     Addendum by Geralyn Flash on June 05, 2020 9:13 EST               BIOPATCH WAS SATURATED WHILE PLACING WITH BLOOD OOZING FROM SITE. PLACED DRY STERILE GAUZE AND TEGEDERM.  WITH A PRESSURE DRESSING.  NOTIFIED ALEX, RN.

## 2020-06-06 LAB — DIFFERENTIAL, MANUAL
Absolute Bands #: 0.2 10*3/uL
Absolute Eos #: 0.1 10*3/uL (ref 0.0–0.5)
Absolute Lymph #: 0.8 10*3/uL — ABNORMAL LOW (ref 1.0–3.2)
Absolute Mono #: 0.6 10*3/uL (ref 0.3–1.0)
Bands Relative: 4 % (ref 0–5)
Eosinophils %: 2 % (ref 0–7)
Lymphocytes: 16 % (ref 15–45)
Lymphs, Absolute Count, Reactive: 0.8 10*3/uL
Metamyelocytes Absolute: 0.1 10*3/uL
Metamyelocytes Relative: 1 %
Monocytes: 12 % (ref 4–12)
Myelocyte Percent: 3 %
Myelocytes Absolute: 0.2 10*3/uL
Neutrophils %. Manual count: 47 % (ref 42–74)
Neutrophils Absolute: 2.4 10*3/uL (ref 1.6–7.3)
Nucleated RBCs: 4 100WBC — ABNORMAL HIGH (ref 0–0)
Platelet Estimate: DECREASED — AB
RBC Morphology: ABNORMAL — AB
Reactive Lymphocytes: 15 %

## 2020-06-06 LAB — COMPREHENSIVE METABOLIC PANEL
ALT: 21 U/L (ref 0–33)
AST: 33 U/L — ABNORMAL HIGH (ref 0–32)
Albumin/Globulin Ratio: 1.6 mmol/L (ref 1.00–2.70)
Albumin: 3.3 g/dL — ABNORMAL LOW (ref 3.5–5.2)
Alk Phosphatase: 70 U/L (ref 35–117)
Anion Gap: 10 mmol/L (ref 2–17)
BUN: 7 mg/dL — ABNORMAL LOW (ref 8–23)
CALCIUM,CORRECTED,CCA: 8.7 mg/dL — ABNORMAL LOW (ref 8.8–10.2)
CO2: 24 mmol/L (ref 22–29)
Calcium: 8.1 mg/dL — ABNORMAL LOW (ref 8.8–10.2)
Chloride: 100 mmol/L (ref 98–107)
Creatinine: 0.5 mg/dL (ref 0.5–1.0)
GFR African American: 110 mL/min/{1.73_m2} (ref >=90)
GFR Non-African American: 95 mL/min/{1.73_m2} (ref >=90)
Globulin: 2.1 g/dL (ref 1.9–4.4)
Glucose: 155 mg/dL — ABNORMAL HIGH (ref 70–99)
OSMOLALITY CALCULATED: 269 mOsm/kg — ABNORMAL LOW (ref 270–287)
Potassium: 3.8 mmol/L (ref 3.5–5.3)
Sodium: 134 mmol/L — ABNORMAL LOW (ref 135–145)
Total Bilirubin: 0.66 mg/dL (ref 0.00–1.20)
Total Protein: 5.4 g/dL — ABNORMAL LOW (ref 6.4–8.3)

## 2020-06-06 LAB — CBC WITH AUTO DIFFERENTIAL
Hematocrit: 20.9 % — CL (ref 34.0–47.0)
Hemoglobin: 7.3 g/dL — ABNORMAL LOW (ref 11.5–15.7)
MCH: 40.1 pg — ABNORMAL HIGH (ref 27.0–34.5)
MCHC: 34.9 g/dL (ref 32.0–36.0)
MCV: 114.8 fL — ABNORMAL HIGH (ref 81.0–99.0)
NRBC Absolute: 0.27 10*3/uL — ABNORMAL HIGH (ref 0.000–0.012)
NRBC Automated: 5.4 % — ABNORMAL HIGH (ref 0.0–0.2)
Platelets: 42 10*3/uL — ABNORMAL LOW (ref 140–440)
RBC: 1.82 x10e6/mcL — ABNORMAL LOW (ref 3.60–5.20)
RDW: 13.9 % (ref 11.0–16.0)
WBC: 5 10*3/uL (ref 3.8–10.6)

## 2020-06-06 LAB — LACTATE DEHYDROGENASE: LD: 1936 U/L — ABNORMAL HIGH (ref 135–214)

## 2020-06-06 LAB — URIC ACID: Uric Acid: 3.2 mg/dL (ref 2.4–5.7)

## 2020-06-06 LAB — PHOSPHORUS: Phosphorus: 3.1 mg/dL (ref 2.5–4.5)

## 2020-06-06 LAB — IMMATURE CELLS
IMMATURE PLT ABSOLUTE: 6.9 10*3/uL
IMMATURE PLT PERCENT: 16.5 % — ABNORMAL HIGH (ref 1.2–8.6)

## 2020-06-06 NOTE — Progress Notes (Signed)
 Chaplaincy Note - Text       Chaplaincy Note Entered On:  06/06/2020 13:18 EST    Performed On:  06/06/2020 13:11 EST by Jamse Ao               Chaplaincy Consult   Faith/Denomination :   Sherlean   Importance of Faith :   Very important   Religious Spiritual Resources :   Family support   Religious Spiritual Practices :   Prayer   Religious Spiritual Concerns :   Anxiety, Fear, Loss of control   Additional Information, Chaplaincy :   Chaplain visited with the pt and her husband late yesterday afternoon. Pt is pleasant & welcoming. She imdicated she will be staying about a month. She was in discomfort as we spoke. Her faith is sustaining her.   Jamse Ao - 06/06/2020 13:11 EST   Follow-Up   Chaplaincy Follow-Up Visit Notes :   I know where I'm going so I have nothing to fear. I offered prayer for peace and comfort. PC will coninue to f/u with pt.     Jamse Ao - 06/06/2020 13:11 EST

## 2020-06-06 NOTE — Case Communication (Signed)
CM Discharge Planning Assessment - Text       CM Progress Note Entered On:  06/06/2020 16:28 EST    Performed On:  06/06/2020 16:27 EST by Thurnell Lose M-RN               CM Progress Note   CM Progress Note :   Attempted CM assessment 06/05/2020 and 06/06/2020, will reattempt 06/07/2020.  BPaulson RN CM     Thurnell Lose M-RN - 06/06/2020 16:27 EST

## 2020-06-07 LAB — CBC WITH AUTO DIFFERENTIAL
Absolute Baso #: 0 10*3/uL (ref 0.0–0.2)
Absolute Eos #: 0 10*3/uL (ref 0.0–0.5)
Absolute Lymph #: 0.6 10*3/uL — ABNORMAL LOW (ref 1.0–3.2)
Absolute Mono #: 1.1 10*3/uL — ABNORMAL HIGH (ref 0.3–1.0)
Basophils %: 0.2 % (ref 0.0–2.0)
Eosinophils %: 0 % (ref 0.0–7.0)
Hematocrit: 19.9 % — CL (ref 34.0–47.0)
Hemoglobin: 6.7 g/dL — CL (ref 11.5–15.7)
Immature Grans (Abs): 0.59 10*3/uL — ABNORMAL HIGH (ref 0.00–0.06)
Immature Granulocytes: 13.1 % — ABNORMAL HIGH (ref 0.0–0.6)
Lymphocytes: 13.1 % — ABNORMAL LOW (ref 15.0–45.0)
MCH: 38.7 pg — ABNORMAL HIGH (ref 27.0–34.5)
MCHC: 33.7 g/dL (ref 32.0–36.0)
MCV: 115 fL — ABNORMAL HIGH (ref 81.0–99.0)
Monocytes: 24.3 % — ABNORMAL HIGH (ref 4.0–12.0)
NRBC Absolute: 0.18 10*3/uL — ABNORMAL HIGH (ref 0.000–0.012)
NRBC Automated: 4 % — ABNORMAL HIGH (ref 0.0–0.2)
Neutrophils %: 49.3 % (ref 42.0–74.0)
Neutrophils Absolute: 2.2 10*3/uL (ref 1.6–7.3)
Platelets: 37 10*3/uL — ABNORMAL LOW (ref 140–440)
RBC: 1.73 x10e6/mcL — ABNORMAL LOW (ref 3.60–5.20)
RDW: 14 % (ref 11.0–16.0)
WBC: 4.5 10*3/uL (ref 3.8–10.6)

## 2020-06-07 LAB — URINALYSIS WITH REFLEX TO CULTURE
Amorphous, UA: NONE SEEN /HPF
Bilirubin Urine: NEGATIVE
Blood, Urine: NEGATIVE
Glucose, UA: NEGATIVE mg/dL
Ketones, Urine: NEGATIVE mg/dL
MUCUS, URINE: NONE SEEN /LPF
Nitrite, Urine: NEGATIVE
Protein, UA: 30 — AB
RBC, UA: NONE SEEN /HPF (ref 0–2)
Specific Gravity, UA: 1.01 (ref 1.003–1.035)
Urobilinogen, Urine: 1 EU/dL
pH, UA: 6 (ref 4.5–8.0)

## 2020-06-07 LAB — COMPREHENSIVE METABOLIC PANEL
ALT: 22 U/L (ref 0–33)
AST: 32 U/L (ref 0–32)
Albumin/Globulin Ratio: 1.6 mmol/L (ref 1.00–2.70)
Albumin: 3.2 g/dL — ABNORMAL LOW (ref 3.5–5.2)
Alk Phosphatase: 64 U/L (ref 35–117)
Anion Gap: 9 mmol/L (ref 2–17)
BUN: 7 mg/dL — ABNORMAL LOW (ref 8–23)
CALCIUM,CORRECTED,CCA: 8.4 mg/dL — ABNORMAL LOW (ref 8.8–10.2)
CO2: 23 mmol/L (ref 22–29)
Calcium: 7.8 mg/dL — ABNORMAL LOW (ref 8.8–10.2)
Chloride: 96 mmol/L — ABNORMAL LOW (ref 98–107)
Creatinine: 0.5 mg/dL (ref 0.5–1.0)
GFR African American: 110 mL/min/{1.73_m2} (ref >=90)
GFR Non-African American: 95 mL/min/{1.73_m2} (ref >=90)
Globulin: 2 g/dL (ref 1.9–4.4)
Glucose: 146 mg/dL — ABNORMAL HIGH (ref 70–99)
OSMOLALITY CALCULATED: 258 mOsm/kg — ABNORMAL LOW (ref 270–287)
Potassium: 3.6 mmol/L (ref 3.5–5.3)
Sodium: 128 mmol/L — ABNORMAL LOW (ref 135–145)
Total Bilirubin: 0.73 mg/dL (ref 0.00–1.20)
Total Protein: 5.2 g/dL — ABNORMAL LOW (ref 6.4–8.3)

## 2020-06-07 LAB — LACTATE DEHYDROGENASE: LD: 2153 U/L — ABNORMAL HIGH (ref 135–214)

## 2020-06-07 LAB — ANTIBODY SCREEN: Antibody Screen: NEGATIVE

## 2020-06-07 LAB — PHOSPHORUS: Phosphorus: 2.9 mg/dL (ref 2.5–4.5)

## 2020-06-07 LAB — ABO/RH: ABO/Rh: B NEG

## 2020-06-07 LAB — IMMATURE CELLS
IMMATURE PLT ABSOLUTE: 7 10*3/uL
IMMATURE PLT PERCENT: 18.8 % — ABNORMAL HIGH (ref 1.2–8.6)

## 2020-06-07 NOTE — Case Communication (Signed)
CM D/C Planning Assessment Ongoing- Text       CM Admission Assessment Entered On:  06/07/2020 15:30 EST    Performed On:  06/07/2020 15:27 EST by Robynn Pane M-RN               CM Admission Assessment   CM Reason for Care Management Referral :   Assess support system, Discharge planning assessment   CM Insurance Information :   Managed Medicaid   CM Insurance Co. Name :   Medicare Advantage   CM Name of Patient Pharmacy :   [facility] [address]   CM Patient has PCP :   Yes   Discharge Transporation Needs :   No   CM Assessment Discussion With :   Family/Caregiver   CM Name of PCP :   Dr Marton Redwood   CM Home/Lay Caregiver Name/Relationship :   Spouse:  Lauren Madden    [phone]   CM Living Situation :   Home with no services   CM Initial Tentative Discharge Plan :   Home with services to be determined   CM Progress Note :   Attempted CM assessment 06/05/2020 and 06/06/2020, will reattempt 06/07/2020.  BPaulson RN CM    06/07/2020:  Met with pt/spouse at bedside.  Pt lives with spouse:  Lauren Madden [phone].  Independent with ADLS, PTA.  DME:  Shower chair.  STE: 3.  Pt and spouse understand 'plan with Dr Bobette Mo and to be here several weeks for treatment.'  Pt and spouse aware that CM will continue to follow for discharge planning needs.  BPaulson RN     CM Admission Assessment Complete :   Yes   Robynn Pane M-RN - 06/07/2020 15:27 EST

## 2020-06-08 LAB — COMPREHENSIVE METABOLIC PANEL
ALT: 22 U/L (ref 0–33)
AST: 31 U/L (ref 0–32)
Albumin/Globulin Ratio: 1.5 mmol/L (ref 1.00–2.70)
Albumin: 3.2 g/dL — ABNORMAL LOW (ref 3.5–5.2)
Alk Phosphatase: 62 U/L (ref 35–117)
Anion Gap: 11 mmol/L (ref 2–17)
BUN: 15 mg/dL (ref 8–23)
CALCIUM,CORRECTED,CCA: 8.8 mg/dL (ref 8.8–10.2)
CO2: 22 mmol/L (ref 22–29)
Calcium: 8.2 mg/dL — ABNORMAL LOW (ref 8.8–10.2)
Chloride: 104 mmol/L (ref 98–107)
Creatinine: 0.5 mg/dL (ref 0.5–1.0)
GFR African American: 110 mL/min/{1.73_m2} (ref >=90)
GFR Non-African American: 95 mL/min/{1.73_m2} (ref >=90)
Globulin: 2.2 g/dL (ref 1.9–4.4)
Glucose: 153 mg/dL — ABNORMAL HIGH (ref 70–99)
OSMOLALITY CALCULATED: 278 mOsm/kg (ref 270–287)
Potassium: 3.8 mmol/L (ref 3.5–5.3)
Sodium: 137 mmol/L (ref 135–145)
Total Bilirubin: 0.87 mg/dL (ref 0.00–1.20)
Total Protein: 5.4 g/dL — ABNORMAL LOW (ref 6.4–8.3)

## 2020-06-08 LAB — CBC WITH AUTO DIFFERENTIAL
Absolute Baso #: 0 10*3/uL (ref 0.0–0.2)
Absolute Eos #: 0 10*3/uL (ref 0.0–0.5)
Absolute Lymph #: 0.4 10*3/uL — ABNORMAL LOW (ref 1.0–3.2)
Absolute Mono #: 0.4 10*3/uL (ref 0.3–1.0)
Basophils %: 0.3 % (ref 0.0–2.0)
Eosinophils %: 0.3 % (ref 0.0–7.0)
Hematocrit: 23.2 % — ABNORMAL LOW (ref 34.0–47.0)
Hemoglobin: 8.2 g/dL — ABNORMAL LOW (ref 11.5–15.7)
Immature Grans (Abs): 0.33 10*3/uL — ABNORMAL HIGH (ref 0.00–0.06)
Immature Granulocytes: 10.9 % — ABNORMAL HIGH (ref 0.0–0.6)
Lymphocytes: 12.6 % — ABNORMAL LOW (ref 15.0–45.0)
MCH: 36.4 pg — ABNORMAL HIGH (ref 27.0–34.5)
MCHC: 35.3 g/dL (ref 32.0–36.0)
MCV: 103.1 fL — ABNORMAL HIGH (ref 81.0–99.0)
Monocytes: 13.9 % — ABNORMAL HIGH (ref 4.0–12.0)
NRBC Absolute: 0.12 10*3/uL — ABNORMAL HIGH (ref 0.000–0.012)
NRBC Automated: 4 % — ABNORMAL HIGH (ref 0.0–0.2)
Neutrophils %: 62 % (ref 42.0–74.0)
Neutrophils Absolute: 1.9 10*3/uL (ref 1.6–7.3)
Platelets: 33 10*3/uL — ABNORMAL LOW (ref 140–440)
RBC: 2.25 x10e6/mcL — ABNORMAL LOW (ref 3.60–5.20)
RDW: 22 % — ABNORMAL HIGH (ref 11.0–16.0)
WBC: 3 10*3/uL — ABNORMAL LOW (ref 3.8–10.6)

## 2020-06-08 LAB — LACTATE DEHYDROGENASE: LD: 1909 U/L — ABNORMAL HIGH (ref 135–214)

## 2020-06-08 LAB — PHOSPHORUS: Phosphorus: 4.2 mg/dL (ref 2.5–4.5)

## 2020-06-08 LAB — IMMATURE CELLS
IMMATURE PLT ABSOLUTE: 5.5 10*3/uL
IMMATURE PLT PERCENT: 16.8 % — ABNORMAL HIGH (ref 1.2–8.6)

## 2020-06-08 LAB — CULTURE, URINE: FINAL REPORT: NO GROWTH

## 2020-06-08 LAB — CYTOMEGALOVIRUS ANTIBODY, IGG: CMV IgG: 9.9 unit/mL — ABNORMAL HIGH (ref 0.00–0.59)

## 2020-06-08 LAB — ADD ON LAB TEST

## 2020-06-08 LAB — URIC ACID: Uric Acid: 2.7 mg/dL (ref 2.4–5.7)

## 2020-06-09 LAB — CBC WITH AUTO DIFFERENTIAL
Hematocrit: 21.3 % — ABNORMAL LOW (ref 34.0–47.0)
Hemoglobin: 7.2 g/dL — ABNORMAL LOW (ref 11.5–15.7)
MCH: 36.2 pg — ABNORMAL HIGH (ref 27.0–34.5)
MCHC: 33.8 g/dL (ref 32.0–36.0)
MCV: 107 fL — ABNORMAL HIGH (ref 81.0–99.0)
NRBC Absolute: 0.04 10*3/uL — ABNORMAL HIGH (ref 0.000–0.012)
NRBC Automated: 2.7 % — ABNORMAL HIGH (ref 0.0–0.2)
Platelets: 27 10*3/uL — ABNORMAL LOW (ref 140–440)
RBC: 1.99 x10e6/mcL — ABNORMAL LOW (ref 3.60–5.20)
RDW: 21.2 % — ABNORMAL HIGH (ref 11.0–16.0)
WBC: 1.5 10*3/uL — CL (ref 3.8–10.6)

## 2020-06-09 LAB — COMPREHENSIVE METABOLIC PANEL
ALT: 22 U/L (ref 0–33)
AST: 29 U/L (ref 0–32)
Albumin/Globulin Ratio: 1.5 mmol/L (ref 1.00–2.70)
Albumin: 2.9 g/dL — ABNORMAL LOW (ref 3.5–5.2)
Alk Phosphatase: 52 U/L (ref 35–117)
Anion Gap: 10 mmol/L (ref 2–17)
BUN: 16 mg/dL (ref 8–23)
CALCIUM,CORRECTED,CCA: 9 mg/dL (ref 8.8–10.2)
CO2: 23 mmol/L (ref 22–29)
Calcium: 8.1 mg/dL — ABNORMAL LOW (ref 8.8–10.2)
Chloride: 102 mmol/L (ref 98–107)
Creatinine: 0.5 mg/dL (ref 0.5–1.0)
GFR African American: 110 mL/min/{1.73_m2} (ref >=90)
GFR Non-African American: 95 mL/min/{1.73_m2} (ref >=90)
Globulin: 1.9 g/dL (ref 1.9–4.4)
Glucose: 103 mg/dL — ABNORMAL HIGH (ref 70–99)
OSMOLALITY CALCULATED: 271 mOsm/kg (ref 270–287)
Potassium: 3.3 mmol/L — ABNORMAL LOW (ref 3.5–5.3)
Sodium: 135 mmol/L (ref 135–145)
Total Bilirubin: 0.67 mg/dL (ref 0.00–1.20)
Total Protein: 4.8 g/dL — ABNORMAL LOW (ref 6.4–8.3)

## 2020-06-09 LAB — PHOSPHORUS: Phosphorus: 3.7 mg/dL (ref 2.5–4.5)

## 2020-06-09 LAB — DIFFERENTIAL, MANUAL
Absolute Lymph #: 0.4 10*3/uL — ABNORMAL LOW (ref 1.0–3.2)
Absolute Mono #: 0.1 10*3/uL — ABNORMAL LOW (ref 0.3–1.0)
Eosinophils %: 2 % (ref 0–7)
Lymphocytes: 24 % (ref 15–45)
Monocytes: 6 % (ref 4–12)
Neutrophils %. Manual count: 68 % (ref 42–74)
Neutrophils Absolute: 1 10*3/uL — ABNORMAL LOW (ref 1.6–7.3)
Platelet Estimate: DECREASED — AB
RBC Morphology: ABNORMAL — AB

## 2020-06-09 LAB — IMMATURE CELLS
IMMATURE PLT ABSOLUTE: 3.8 10*3/uL
IMMATURE PLT PERCENT: 13.9 % — ABNORMAL HIGH (ref 1.2–8.6)

## 2020-06-09 LAB — LACTATE DEHYDROGENASE: LD: 1581 U/L — ABNORMAL HIGH (ref 135–214)

## 2020-06-09 LAB — POTASSIUM: Potassium: 3.6 mmol/L (ref 3.5–5.3)

## 2020-06-09 LAB — POCT GLUCOSE: POC Glucose: 111 mg/dL (ref 70.0–120.0)

## 2020-06-09 LAB — URIC ACID: Uric Acid: 3 mg/dL (ref 2.4–5.7)

## 2020-06-10 LAB — COMPREHENSIVE METABOLIC PANEL
ALT: 21 U/L (ref 0–33)
AST: 25 U/L (ref 0–32)
Albumin/Globulin Ratio: 1.6 mmol/L (ref 1.00–2.70)
Albumin: 3.3 g/dL — ABNORMAL LOW (ref 3.5–5.2)
Alk Phosphatase: 54 U/L (ref 35–117)
Anion Gap: 11 mmol/L (ref 2–17)
BUN: 18 mg/dL (ref 8–23)
CALCIUM,CORRECTED,CCA: 9.1 mg/dL (ref 8.8–10.2)
CO2: 25 mmol/L (ref 22–29)
Calcium: 8.5 mg/dL — ABNORMAL LOW (ref 8.8–10.2)
Chloride: 101 mmol/L (ref 98–107)
Creatinine: 0.4 mg/dL — ABNORMAL LOW (ref 0.5–1.0)
GFR African American: 119 mL/min/{1.73_m2} (ref >=90)
GFR Non-African American: 103 mL/min/{1.73_m2} (ref >=90)
Globulin: 2 g/dL (ref 1.9–4.4)
Glucose: 140 mg/dL — ABNORMAL HIGH (ref 70–99)
OSMOLALITY CALCULATED: 278 mOsm/kg (ref 270–287)
Potassium: 3.6 mmol/L (ref 3.5–5.3)
Sodium: 137 mmol/L (ref 135–145)
Total Bilirubin: 0.79 mg/dL (ref 0.00–1.20)
Total Protein: 5.3 g/dL — ABNORMAL LOW (ref 6.4–8.3)

## 2020-06-10 LAB — CBC WITH AUTO DIFFERENTIAL
Absolute Baso #: 0 10*3/uL (ref 0.0–0.2)
Absolute Eos #: 0 10*3/uL (ref 0.0–0.5)
Absolute Lymph #: 0.2 10*3/uL — ABNORMAL LOW (ref 1.0–3.2)
Absolute Mono #: 0.1 10*3/uL — ABNORMAL LOW (ref 0.3–1.0)
Basophils %: 0 % (ref 0.0–2.0)
Eosinophils %: 0 % (ref 0.0–7.0)
Hematocrit: 27.1 % — ABNORMAL LOW (ref 34.0–47.0)
Hemoglobin: 9.4 g/dL — ABNORMAL LOW (ref 11.5–15.7)
Immature Grans (Abs): 0.11 10*3/uL — ABNORMAL HIGH (ref 0.00–0.06)
Immature Granulocytes: 9.2 % — ABNORMAL HIGH (ref 0.0–0.6)
Lymphocytes: 13.4 % — ABNORMAL LOW (ref 15.0–45.0)
MCH: 35.7 pg — ABNORMAL HIGH (ref 27.0–34.5)
MCHC: 34.7 g/dL (ref 32.0–36.0)
MCV: 103 fL — ABNORMAL HIGH (ref 81.0–99.0)
Monocytes: 6.7 % (ref 4.0–12.0)
NRBC Absolute: 0.05 10*3/uL — ABNORMAL HIGH (ref 0.000–0.012)
NRBC Automated: 4.2 % — ABNORMAL HIGH (ref 0.0–0.2)
Neutrophils %: 70.7 % (ref 42.0–74.0)
Neutrophils Absolute: 0.8 10*3/uL — ABNORMAL LOW (ref 1.6–7.3)
Platelets: 23 10*3/uL — CL (ref 140–440)
RBC: 2.63 x10e6/mcL — ABNORMAL LOW (ref 3.60–5.20)
RDW: 19.5 % — ABNORMAL HIGH (ref 11.0–16.0)
WBC: 1.2 10*3/uL — CL (ref 3.8–10.6)

## 2020-06-10 LAB — URIC ACID: Uric Acid: 2.9 mg/dL (ref 2.4–5.7)

## 2020-06-10 LAB — LACTATE DEHYDROGENASE: LD: 1529 U/L — ABNORMAL HIGH (ref 135–214)

## 2020-06-10 LAB — IMMATURE CELLS
IMMATURE PLT ABSOLUTE: 3 10*3/uL
IMMATURE PLT PERCENT: 13 % — ABNORMAL HIGH (ref 1.2–8.6)

## 2020-06-10 LAB — POTASSIUM: Potassium: 4.6 mmol/L (ref 3.5–5.3)

## 2020-06-10 LAB — PHOSPHORUS: Phosphorus: 4.2 mg/dL (ref 2.5–4.5)

## 2020-06-11 DIAGNOSIS — M79602 Pain in left arm: Secondary | ICD-10-CM | POA: Diagnosis not present

## 2020-06-11 LAB — COMPREHENSIVE METABOLIC PANEL
ALT: 20 U/L (ref 0–33)
AST: 28 U/L (ref 0–32)
Albumin/Globulin Ratio: 1.4 mmol/L (ref 1.00–2.70)
Albumin: 3 g/dL — ABNORMAL LOW (ref 3.5–5.2)
Alk Phosphatase: 48 U/L (ref 35–117)
Anion Gap: 9 mmol/L (ref 2–17)
BUN: 18 mg/dL (ref 8–23)
CALCIUM,CORRECTED,CCA: 9.1 mg/dL (ref 8.8–10.2)
CO2: 26 mmol/L (ref 22–29)
Calcium: 8.3 mg/dL — ABNORMAL LOW (ref 8.8–10.2)
Chloride: 100 mmol/L (ref 98–107)
Creatinine: 0.4 mg/dL — ABNORMAL LOW (ref 0.5–1.0)
GFR African American: 119 mL/min/{1.73_m2} (ref >=90)
GFR Non-African American: 103 mL/min/{1.73_m2} (ref >=90)
Globulin: 2.1 g/dL (ref 1.9–4.4)
Glucose: 94 mg/dL (ref 70–99)
OSMOLALITY CALCULATED: 271 mOsm/kg (ref 270–287)
Potassium: 3.5 mmol/L (ref 3.5–5.3)
Sodium: 135 mmol/L (ref 135–145)
Total Bilirubin: 0.7 mg/dL (ref 0.00–1.20)
Total Protein: 5.1 g/dL — ABNORMAL LOW (ref 6.4–8.3)

## 2020-06-11 LAB — DIFFERENTIAL, MANUAL
Absolute Lymph #: 0.4 10*3/uL — ABNORMAL LOW (ref 1.0–3.2)
Eosinophils %: 1 % (ref 0–7)
Lymphocytes: 41 % (ref 15–45)
Monocytes: 2 % — ABNORMAL LOW (ref 4–12)
Neutrophils %. Manual count: 55 % (ref 42–74)
Neutrophils Absolute: 0.5 10*3/uL — ABNORMAL LOW (ref 1.6–7.3)
Nucleated RBCs: 1 100WBC — ABNORMAL HIGH (ref 0–0)
Platelet Estimate: DECREASED — AB
RBC Morphology: ABNORMAL — AB
Reactive Lymphocytes: 1 %

## 2020-06-11 LAB — IMMATURE CELLS
IMMATURE PLT ABSOLUTE: 1.3 10*3/uL
IMMATURE PLT PERCENT: 8.5 % (ref 1.2–8.6)

## 2020-06-11 LAB — CBC WITH AUTO DIFFERENTIAL
Hematocrit: 25.5 % — ABNORMAL LOW (ref 34.0–47.0)
Hemoglobin: 8.9 g/dL — ABNORMAL LOW (ref 11.5–15.7)
MCH: 35.6 pg — ABNORMAL HIGH (ref 27.0–34.5)
MCHC: 34.9 g/dL (ref 32.0–36.0)
MCV: 102 fL — ABNORMAL HIGH (ref 81.0–99.0)
NRBC Absolute: 0.04 10*3/uL — ABNORMAL HIGH (ref 0.000–0.012)
NRBC Automated: 4.6 % — ABNORMAL HIGH (ref 0.0–0.2)
Platelets: 15 10*3/uL — CL (ref 140–440)
RBC: 2.5 x10e6/mcL — ABNORMAL LOW (ref 3.60–5.20)
RDW: 19 % — ABNORMAL HIGH (ref 11.0–16.0)
WBC: 0.9 10*3/uL — CL (ref 3.8–10.6)

## 2020-06-11 LAB — VITAMIN D 25 HYDROXY: Vit D, 25-Hydroxy: 34.8 ng/mL (ref 30.0–90.0)

## 2020-06-11 LAB — LACTATE DEHYDROGENASE: LD: 1317 U/L — ABNORMAL HIGH (ref 135–214)

## 2020-06-11 LAB — URIC ACID: Uric Acid: 2.4 mg/dL (ref 2.4–5.7)

## 2020-06-11 LAB — PHOSPHORUS: Phosphorus: 3.6 mg/dL (ref 2.5–4.5)

## 2020-06-11 LAB — POTASSIUM: Potassium: 3.4 mmol/L — ABNORMAL LOW (ref 3.5–5.3)

## 2020-06-11 NOTE — Progress Notes (Signed)
Chaplaincy Note - Text       Chaplaincy Note Entered On:  06/11/2020 15:39 EST    Performed On:  06/11/2020 15:38 EST by Mellody Dance               Chaplaincy Consult   Faith/Denomination :   Jerrye Bushy - 06/11/2020 15:38 EST   Follow-Up   Chaplaincy Follow-Up Visit Notes :   Chaplain knocked on the pt's door and found the pt resting in bed and feeling poorly. Her husband was bedside.I offered blessings to the pt and said I would return another day.PC f/u.     Mellody Dance - 06/11/2020 15:38 EST

## 2020-06-11 NOTE — Progress Notes (Signed)
 Inpatient PT Examination - Text       Inpatient PT Examination Entered On:  06/11/2020 14:15 EST    Performed On:  06/11/2020 8:42 EST by Fuss, PT, Josette SAILOR               Reason for Treatment   Subjective Statement :   RN and pt in agreement with PT eval. Pt reporting no pain at rest, but inc. L ankle pain when PT assisting to lift LE to remove SCD. Pt's husband present throughout the session. Of note, pt reporting that she and her husband have been getting to and from the [facility] without nursing and have it all figured out. RN aware and PT with minor concerns 2T instability observed this date. RN in agreement for bed alarm to bed on and PT also educated pt on the need for nursing staff to be present at all times when attempting OOB mobility. Pt left in bed with husband present, call bell and all needs within reach, B heels floating, B SCD's intact, IV intact, bed alarm on and no signs of distress.     *Reason for Referral :   PT consult - falls risk, IV, B UE limb alert (LUE PICC; BP to L calf), L UE DVT, high risk hazardous drug precautions    06/04/20 - pt with hx of polycythemia vera, presenting with acute thrombocytopenia, acute anemia and a multitude of constitutional symptoms concerning for leukemia. 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   06/05/20 - induction chemotherapy initiated; L UE PICC  06/11/20 - L UE swelling. B UE vascular studies; L UE minimum of occlusive peri-catheter thrombus in the Basilic vein immediately above the PICC insertion - negative for acute DVT.    PMH: polycythemia vera, CVA, PE, seizure disorder, breast cancer, shingles    PLOF: Pt reporting she lives with her husband in a condo with elevator access. They also have a home in [place] , however, plan to remain here after being discharged. Pt is IND at baseline without AD. She denies falls, though, endorses that since the end of January, she's presented  with low appetite and decreased energy overall. Pt owns a tub shower with shower stool, but no other DME. She and her husband used to enjoy golfing, however, she has macular degeneration and has a difficult time tracking the ball now.       Fuss, PT, Josette SAILOR - 06/11/2020 15:11 EST   General Info   Physical Therapy Orders :   Physical Therapy Evaluation and Treatment Acute - 06/09/20 10:38:00 EST, AM performed - prior to noon, Daily, A consult cannot be completed if there is a bedrest activity order; check for bedrest order.     Precautions RTF :    Dietary Preferences, 06/10/20 12:01:00 EST, Constant order, Please make sure all water sent is bottled., 06/10/20 12:01:00 EST, Ordered   Communication to Nursing, 06/09/20 10:35:00 EST, dc purewickelevate left arm on 2 pillowsnote change in lasix protocol and one-time lasix order, 06/09/20 10:35:00 EST, 06/09/20 10:35:00 EST, Ordered   Notify Provider Laboratory Results, 06/09/20 5:57:00 EST, If outpatient, call MD for any replacement requiring a 12 hour repeat lab, 06/09/20 5:57:00 EST, Acute leukemia, Ordered   Notify Provider Laboratory Results, 06/07/20 5:28:00 EST, If outpatient, call MD for any replacement requiring a 12 hour repeat lab, 06/07/20 5:28:00 EST, Acute leukemia, Ordered   High Risk HD Precautions, 06/05/20 15:45:45 EST, Constant Order,  Ordered   Notify Provider Laboratory Results, 06/05/20 12:34:00 EST, If outpatient, call MD for any replacement requiring a 12 hour repeat lab, 06/05/20 12:34:00 EST, Acute leukemia, Ordered   Communication to Nursing, 06/05/20 9:08:00 EST, DO NOT remove PICC line without an order, 06/05/20 9:08:00 EST, 06/05/20 9:08:00 EST, Ordered   Communication to Nursing, 06/05/20 9:08:00 EST, PICC OK for use once placement is confirmed., 06/05/20 9:08:00 EST, 06/05/20 9:08:00 EST, Ordered   Communication to Nursing, 06/05/20 9:08:00 EST, Place pink limb alert bracelet on arm with PICC, 06/05/20 9:08:00 EST, 06/05/20 9:08:00 EST,  Ordered   Communication to Nursing, 06/05/20 9:08:00 EST, May use PICC line to draw blood if no peripheral possible., 06/05/20 9:08:00 EST, 06/05/20 9:08:00 EST, Ordered   Notify Provider, 06/05/20 9:08:00 EST, Vascular Line Team of PICC occlusion, leaking, migration, or redness/edema of hand or arm., 06/05/20 9:08:00 EST, 06/05/20 9:08:00 EST, Ordered   Notify Provider, 06/05/20 9:08:00 EST, Primary MD of PICC occlusion, leaking, migration, or redness/edema of hand or arm., 06/05/20 9:08:00 EST, 06/05/20 9:08:00 EST, Ordered   Notify Provider Laboratory Results, 06/05/20 8:19:00 EST, If outpatient, call MD for any replacement requiring a 12 hour repeat lab, 06/05/20 8:19:00 EST, Acute leukemia, Ordered   Communication to Nursing, 06/05/20 8:17:00 EST, Strict I/O's, Use BMT flowsheet, 06/05/20 8:17:00 EST, 06/05/20 8:17:00 EST, Ordered   Low Risk HD Precaution, 06/04/20 21:06:17 EST, Constant Order, Ordered   Notify Provider, 06/04/20 16:02:00 EST, If 4 units PRBC given in 12 hours or 6 units given in 24 hours, 06/04/20 16:02:00 EST, 06/04/20 16:02:00 EST, Ordered   Transfuse Platelets If, 06/04/20 16:02:00 EST, 1 Units, Platelet Count less than 10,000/mm3, 06/04/20 16:02:00 EST, Ordered   Transfuse Red Blood Cells If, 06/04/20 16:02:00 EST, 1 Units, Hemoglobin less than 7 mg/dL, 97/85/77 83:97:99 EST, Ordered   Communication to Nursing, 06/04/20 16:00:00 EST, RD may manage/modify diet order and/or enteral nutrition per approved MNT protocol, 06/04/20 16:00:00 EST, 06/04/20 16:00:00 EST, Ordered   Notify Rapid Response Team, 06/04/20 16:00:00 EST, For concerns regarding patient condition & notify MD, 06/04/20 16:00:00 EST, 06/04/20 16:00:00 EST, Ordered   COVID-19 Status, 06/04/20 14:54:00 EST, NOT VALID FOR pharmacy, laboratory, radiology., 06/04/20 14:54:00 EST, COVID-19 Not Detected, Ordered   Notify Provider, 06/04/20 14:54:00 EST, This message can only be seen by Nursing, it is not visible to Pharmacy,  Laboratory, or Radiology., 06/04/20 14:54:00 EST, Ordered     Orientation Assessment :   Oriented x 4   Affect/Behavior :   Other: See comment   (Comment: Pt preferring to perform tasks the way she and her husband have been doing them, and often relied on her husband performing tasks for her. Attempted to educate on the importance of pt continuing to attempt all mobility and ADL's prior to requesting help (i.e. feeding herself, holding a cup to drink out of, and bringing her legs back into the bed), as the pt demonstrated strength adequate to complete those tasks. Pt fairly dismissive this date and noting that she is just in a rut and is very independent. Pt pleasant, though appearing to gain control via how she performed the tasks and preferred not to have assistance from the PT at this time. [Fuss, PT, Josette SAILOR - 06/11/2020 14:19 EST] )   Safety/Judgment :   Impaired   Pain Present :   Yes actual or suspected pain   Fuss, PT, Josette SAILOR - 06/11/2020 15:11 EST   Problem List   (As Of: 06/11/2020 14:31:38 EST)  Problems(Active)    Absent parathyroid gland (SNOMED CT  :588581987 )  Name of Problem:   Absent parathyroid gland ; Recorder:   PLUKAS, RN, JESSICA; Confirmation:   Confirmed ; Classification:   Patient Stated ; Code:   588581987 ; Contributor System:   PowerChart ; Last Updated:   06/04/2020 15:38 EST ; Life Cycle Date:   06/04/2020 ; Life Cycle Status:   Active ; Vocabulary:   SNOMED CT        Acid reflux (SNOMED CT  :646864985 )  Name of Problem:   Acid reflux ; Recorder:   PLUKAS, RN, JESSICA; Confirmation:   Confirmed ; Classification:   Patient Stated ; Code:   646864985 ; Contributor System:   PowerChart ; Last Updated:   06/04/2020 15:38 EST ; Life Cycle Date:   06/04/2020 ; Life Cycle Status:   Active ; Vocabulary:   SNOMED CT        Acute cerebrovascular accident (CVA) (SNOMED CT  :654362987 )  Name of Problem:   Acute cerebrovascular accident (CVA) ; Recorder:   PLUKAS, RN, JESSICA; Confirmation:    Confirmed ; Classification:   Patient Stated ; Code:   654362987 ; Contributor System:   PowerChart ; Last Updated:   06/04/2020 15:32 EST ; Life Cycle Date:   06/04/2020 ; Life Cycle Status:   Active ; Vocabulary:   SNOMED CT        Breast CA (SNOMED CT  :620338983 )  Name of Problem:   Breast CA ; Recorder:   PLUKAS, RN, JESSICA; Confirmation:   Confirmed ; Classification:   Patient Stated ; Code:   620338983 ; Contributor System:   PowerChart ; Last Updated:   06/04/2020 15:34 EST ; Life Cycle Date:   06/04/2020 ; Life Cycle Status:   Active ; Vocabulary:   SNOMED CT        Polycythemia vera (SNOMED CT  :825302986 )  Name of Problem:   Polycythemia vera ; Recorder:   PLUKAS, RN, JESSICA; Confirmation:   Confirmed ; Classification:   Patient Stated ; Code:   825302986 ; Contributor System:   PowerChart ; Last Updated:   06/04/2020 15:32 EST ; Life Cycle Date:   06/04/2020 ; Life Cycle Status:   Active ; Vocabulary:   SNOMED CT        Pulmonary embolism (SNOMED CT  :01515983 )  Name of Problem:   Pulmonary embolism ; Recorder:   PLUKAS, RN, JESSICA; Confirmation:   Confirmed ; Classification:   Patient Stated ; Code:   01515983 ; Contributor System:   PowerChart ; Last Updated:   06/04/2020 15:36 EST ; Life Cycle Date:   06/04/2020 ; Life Cycle Status:   Active ; Vocabulary:   SNOMED CT        Shingles (SNOMED CT  :1097981 )  Name of Problem:   Shingles ; Recorder:   PLUKAS, RN, JESSICA; Confirmation:   Confirmed ; Classification:   Patient Stated ; Code:   1097981 ; Contributor System:   PowerChart ; Last Updated:   06/04/2020 15:37 EST ; Life Cycle Date:   06/04/2020 ; Life Cycle Status:   Active ; Vocabulary:   SNOMED CT        Shortness of breath (IMO  :72576 )  Name of Problem:   Shortness of breath ; Recorder:   SYSTEM,  SYSTEM; Confirmation:   Confirmed ; Classification:   Patient Stated ; Code:   72576 ; Last Updated:   06/07/2020 12:35  EST ; Life Cycle Date:   06/07/2020 ; Life Cycle Status:   Active ; Vocabulary:    IMO          Diagnoses(Active)    Acute leukemia  Date:   06/05/2020 ; Diagnosis Type:   Discharge ; Confirmation:   Confirmed ; Clinical Dx:   Acute leukemia ; Classification:   Medical ; Clinical Service:   Non-Specified ; Code:   ICD-10-CM ; Probability:   0 ; Diagnosis Code:   C95.00      Decreased mobility  Date:   06/11/2020 ; Diagnosis Type:   Reason For Visit ; Confirmation:   Confirmed ; Clinical Dx:   Decreased mobility ; Classification:   Interdisciplinary ; Clinical Service:   Non-Specified ; Code:   ICD-10-CM ; Probability:   0 ; Diagnosis Code:   R26.89      Pulmonary embolism  Date:   06/05/2020 ; Diagnosis Type:   Discharge ; Confirmation:   Confirmed ; Clinical Dx:   Pulmonary embolism ; Classification:   Medical ; Code:   ICD-10-CM ; Probability:   0 ; Diagnosis Code:   I26.99        Pain Assessment   Pain Present :   Yes actual or suspected pain   Duration :   See subjective   Fuss, PT, Josette SAILOR - 06/11/2020 15:11 EST   Home Environment   Lives With :   Spouse   Lives In :   Other: condo   Prior Accessibility Options :   Elevator   Fuss, PT, Josette SAILOR - 06/11/2020 15:11 EST   Home Setup   Primary Bedroom :   1st floor   Primary Bathroom :   1st floor   Kitchen :   1st floor   Laundry :   1st floor   [place], [place], Josette SAILOR - 06/11/2020 15:11 EST   Living Environment :   Home Environment  Sensory Deficits:  Other: macular degeneration  Performed By:  LIBBY, RN, JESSICA 06/04/2020     Fuss, PT, Josette SAILOR - 06/11/2020 15:11 EST   Home Environment II   Devices/Equipment :   Other: shower stool   Fuss, PT, Kristen N - 06/11/2020 15:11 EST   Prior Functional Status   ADL :   Independent   Mobility :   Independent   Instrumental ADL :   Independent   Cognitive-Communication Skills :   Independent   Fuss, PT, Josette SAILOR - 06/11/2020 15:11 EST   Living Environment :   Home Environment  Sensory Deficits:  Other: macular degeneration  Performed By:  LIBBY, RN, JESSICA 06/04/2020     Fuss, PT, Josette SAILOR - 06/11/2020  15:11 EST   LE Range/Strength   LE Overall Range of Motion Grid   Left Lower Extremity Passive Range :   Within functional limits   Left Lower Extremity Active Range :   Impaired   Right Lower Extremity Passive Range :   Within functional limits   Right Lower Extremity Active Range :   Impaired   Fuss, PT, Kristen N - 06/11/2020 15:11 EST   Left Lower Extremity Strength Grid   Hip Flexion :   2+   Knee Flexion :   4-   Knee Extension :   4-   Ankle Dorsiflexion :   4-   Ankle Plantarflexion :   4-   Fuss, PT, Kristen N - 06/11/2020 15:11 EST   Right Lower Extremity Strength Grid  Hip Flexion :   2+   Knee Flexion :   4-   Knee Extension :   4-   Ankle Dorsiflexion :   4-   Ankle Plantarflexion :   4-   Fuss, PT, Kristen N - 06/11/2020 15:11 EST   UE Tone         remainder (less than 1/2 of ROM)   Left Upper Extremity :   Normal   Right Upper Extremity :   Normal   Fuss, PT, Kristen N - 06/11/2020 15:11 EST   Sensation   Left Upper Extremity Sensation   Light Touch :   Intact   Fuss, PT, Kristen N - 06/11/2020 15:11 EST   Right Upper Extremity Sensation   Light Touch :   Intact   Fuss, PT, Kristen N - 06/11/2020 15:11 EST   Left Lower Extremity Sensation   Light Touch :   Intact   Fuss, PT, Kristen N - 06/11/2020 15:11 EST   Right Lower Extremity Sensation   Light Touch :   Intact   Fuss, PT, Kristen N - 06/11/2020 15:11 EST   Mobility   Mobility Grid   Supine to Sit :   Rehab Minimal assistance   (Comment: HOB elevated, inc time to complete task and pt requesting assistance from her husband [Fuss, PT, Josette N - 06/11/2020 14:37 EST] )   Sit to Supine :   Rehab Minimal assistance   (Comment:  [Fuss, PT, Josette N - 06/11/2020 14:37 EST] )   Transfer Sit to Stand :   Rehab Minimal assistance   (Comment: PT positioned anteriorly and pt requesting B HHA from husband and PT. Slow to come to stance. Cueing for sequencing and hand placement [Fuss, PT, Josette N - 06/11/2020 14:37 EST] )   Transfer Stand to Sit :   Rehab Minimal  assistance   (Comment:  [Fuss, PT, Josette N - 06/11/2020 14:37 EST] )   Transfer Toilet :   Rehab Minimal assistance   (Comment: stand pivot bed > BSC with PT positioned anterior and HHA. Pt denying need for AD, and noting she and her husband have it down perfectly [Fuss, PT, Josette N - 06/11/2020 14:37 EST] )   Fuss, PT, Kristen N - 06/11/2020 15:11 EST   Ambulation Level Acute :   Minimal assistance   Ambulation Quality :   Pt amb with HHA and Min A x 78ft and demo narrow BOS, slow cadence, flexed posture and lateral instability. Pt denying need for AD despite PT education and encouragement. PT cueing for erect posture. Limited 2T fatigue   Ambulation Distance :   12 ft   Device :   Gait belt   Fuss, PT, Kristen N - 06/11/2020 15:11 EST   Vital Signs   Peripheral Pulse Rate :   96 bpm   Systolic/ Diastolic  BP :   835 mmHg (HI)    Diastolic Blood Pressure :   73 mmHg   O2 Saturation :   93 %   O2 Therapy :   Room air   Additional Information :   Pt asymptomatic throuhgout the session on RA; BP taken to L calf   Fuss, PT, Kristen N - 06/11/2020 15:11 EST   AM-PAC Basic Mobility   AM-PAC Basic Mobility   Turning from your back on your side while in a flat bed without using bedrails? :   A little = 3 points   Moving from lying on your  back to sitting on the side of a flat bed without using bedrails? :   A little = 3 points   Moving to and from a bed to a chair (including a wheelchair)? :   A little = 3 points   Standing up from a chair using your arms (e.g. wheelchair or bedside chair)? :   A little = 3 points   To walk in hospital room? :   A little = 3 points   Fuss, PT, Josette SAILOR - 06/11/2020 15:11 EST   AM-PAC Mobility Raw Score :   15    Fuss, PT, Kristen N - 06/11/2020 15:11 EST   Assessment   PT Impairments or Limitations :   Ambulation deficits, Balance deficits, Bed mobility deficits, Endurance deficits, Equipment training, Safety awareness deficits, Strength deficits, Transfer deficits, Transition deficits,  Visual perceptual deficits   Barriers to Safe Discharge PT :   Limited family support, Safety awareness   Discharge Recommendations :   PT with continued assessment between rehab vs. return home with increased family support, AD as further identified and HHPT pending clinical course and pt's ability to tolerate progression towards goals. If pt does return home, she will likely require an assistive device, 3-in-1 BSC and a tub transfer bench. Will continue to assess.     D/CTransportation Recommendations :   Stretcher/Requires skilled assist for transfer   D/C Transportation Recommendations Reviewed :   Yes   PT Treatment Recommendations :   ASSESSMENT: Pt 75 y/o female with longstanding history of polycythemia vera, presenting with acute thrombocytopenia, acute anemia and a multitude of constitutional symptoms concerning for leukemia. 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 Of note, pt now presenting with L UE swelling and vascular study negative for acute DVT, though, revealing minimum of occlusive peri-catheter thrombus in the Basilic vein immediately above the PICC insertion. Pt with IND PLOF and lives with her husband in a condo with elevator access. Upon eval, pt was pleasant overall, however, she and her husband preferred to perform tasks the way they have been doing them since admitted (i.e. husband feeding her, holding the cup for her to drink, assisting her in and out of bed and to and from the [facility]). Pt observing pt presenting with adequate strength to perform the ADL's and bed mobility without physical assistance and educated/encouraged pt to at least attempt initiating them without assistance prior to requesting assistance, to improve strength and activity tolerance. This date, pt fairly dismissive of this education and preferring for her husband to initiate assistance with all tasks  anyways - pt required Min A for bed mobility, Min A for sit <> stand with B HHA, and Min A for stand pivot bed > BSC. She was also able to tolerate 107ft of ambulation with HHA and min A 2T lateral instability and inc. fatigue. PT attempted to encourge attempt at ambulation with FWW, however, pt declined this date. Pt's vitals remained stable on RA this date, though will continue to assess to ensure safety moving forward. At this time, pt presenting with gross weakness, gross deconditioning, impaired balance, impaired functional mobility, impaired safety awareness and she will likely conitnue to benefit from skilled IPPT to address deficits and maximzie safety at d/c. Of note, PT initiated education regarding energy conservation techniques and potential equipment to aid in safety and energy conservation at d/c and will continue to assess for need as  pt is able to progress towards goals. Additionally, at this time, PT recommending use of FWW to improve stability in standing activities, however, pt not interested this date. Will continue to assess and educate as able. Pt also appropriate to continue mobilizing with nursing staff and FWW multiple times daily, as tolerated.       Fuss, PT, Josette SAILOR - 06/11/2020 15:11 EST     Education   Responsible Learner Present for Session :   Yes   Home Caregiver Name/Relationship :   Lael   Teaching Method :   Demonstration, Explanation   Fuss, PT, Josette SAILOR - 06/11/2020 15:02 EST   Physical Therapy Education Grid   Ambulation with Nurse, adult :   Verbalizes understanding, Demonstrates, Needs further teaching, Needs practice/supervision   Bed Mobility :   Verbalizes understanding, Demonstrates, Needs further teaching, Needs practice/supervision   Bed Positioning :   Verbalizes understanding, Demonstrates, Needs further teaching, Needs practice/supervision   Bed to Chair Transfers :   Verbalizes understanding, Demonstrates, Needs further teaching, Needs practice/supervision   Home  Safety :   Verbalizes understanding, Demonstrates, Needs further teaching, Needs practice/supervision   Physical Therapy Plan of Care :   Verbalizes understanding, Demonstrates, Needs further teaching, Needs practice/supervision   Fuss, PT, Josette SAILOR - 06/11/2020 15:02 EST   Long Term Goals   PT LT Goals Reviewed :   Yes   PT Patient,Caregiver Goal :   to return home safely   Fuss, PT, Kristen N - 06/11/2020 15:02 EST   Outpatient PT Long Term Goals Rehab     Long Term Goal 1          Goal :    Pt will amb x 317ft with LRAD/no AD and SBA without rest breaks or LOB within 14 tx.              Status :    Initial                Fuss, PT, Josette SAILOR - 06/11/2020 15:02 EST         PT Mobility Independence Measure LTG   Bed, Chair, Wheelchair Goal :   Modified independence - 6   Ambulation Level Surfaces Goal :   Modified independence - 6   Fuss, PT, Kristen N - 06/11/2020 15:02 EST   Mode of Locomotion Goal :   Walk   Fuss, PT, Kristen N - 06/11/2020 15:02 EST   Short Term Goals   PT ST Goals Reviewed :   Yes   Fuss, PT, Josette SAILOR - 06/11/2020 15:02 EST   Other PT Goals Grid     Goal #1  Goal #2  Goal #3  Goal #4    Goal :    Pt will perform sup <> sit with HOB flat, no bedrails and SBA within 7 tx.   Pt will perform sit <> stand with LRAD/no AD and SBA within 7 tx.   Pt will perform static and dynamic standing balance activities with SBA and no LOB x 5 min within 7 tx.   Pt will perform stand pivot transfer with LRAD/no AD and SBA within 7 tx.     Status :    Initial   Initial   Initial   Initial       Fuss, PT, Josette SAILOR - 06/11/2020 15:02 EST  Fuss, PT, Josette SAILOR - 06/11/2020 15:02 EST  Fuss, PT, Josette N - 06/11/2020 15:02 EST  Fuss, PT, Josette SAILOR -  06/11/2020 15:02 EST        Goal #5  Goal #6        Goal :    Pt will amb x 127ft with LRAD/no AD and SBA without rest breaks or LOB within 7 tx.   Pt will demo IND with gross B LE HEP within 7 tx.           Status :    Initial   Initial             Fuss, PT, Josette SAILOR - 06/11/2020  15:02 EST  Fuss, PT, Josette SAILOR - 06/11/2020 15:02 EST        Plan   PT Frequency Acute :   Mo/We/Fr   Duration :   2    PT Duration Unit Rehab :   Weeks   Treatments Planned :   Balance training, Basic activities of daily living, Bed mobility training, Equipment training, Functional training, Gait training, Posture/Body mechanics training, Patient education, Therapeutic activities, Therapeutic exercises, Transfer training   Treatment Plan/Goals Established With Patient/Caregiver :   Yes   Evaluation Complete :   Yes   Fuss, PT, Josette SAILOR - 06/11/2020 15:02 EST   Time Spent With Patient   PT Time In :   8:42 EST   PT Time Out :   9:26 EST   PT Individual Eval Time, Low Complexity :   15 minutes   PT Evaluation Units, Low Complexity :   1 Unit   PT Functional Training Units :   2 units   PT Functional Training Time :   29 minutes   PT Total Timed Code Treatment Units :   2 units   PT Total Timed Code Min :   29    PT Total Untimed Min :   15    PT Total Treatment Time Acute/OP :   44    Fuss, PT, Kristen N - 06/11/2020 15:02 EST

## 2020-06-11 NOTE — Case Communication (Signed)
CM Discharge Planning Assessment - Text       CM Progress Note Entered On:  06/11/2020 17:02 EST    Performed On:  06/11/2020 17:01 EST by Robynn Pane M-RN               CM Progress Note   CM Home/Lay Caregiver Name/Relationship :   Spouse:  Ader Fritze    [phone]   CM Initial Tentative Discharge Plan :   Home with services to be determined   CM Progress Note :   Attempted CM assessment 06/05/2020 and 06/06/2020, will reattempt 06/07/2020.  BPaulson RN CM    06/07/2020:  Met with pt/spouse at bedside.  Pt lives with spouse:  Lauren Madden [phone].  Independent with ADLS, PTA.  DME:  Shower chair.  STE: 3.  Pt and spouse understand 'plan with Dr Bobette Mo and to be here several weeks for treatment.'  Pt and spouse aware that CM will continue to follow for discharge planning needs.  BPaulson RN    06/11/2020:  Reviewed chart:  Referred pt to [facility] PT, per in-pt PT recomendation.  CM will continue to follow.  BPaulson RN CM     Robynn Pane M-RN - 06/11/2020 17:01 EST

## 2020-06-12 LAB — COMPREHENSIVE METABOLIC PANEL
ALT: 20 U/L (ref 0–33)
AST: 27 U/L (ref 0–32)
Albumin/Globulin Ratio: 1.5 mmol/L (ref 1.00–2.70)
Albumin: 2.9 g/dL — ABNORMAL LOW (ref 3.5–5.2)
Alk Phosphatase: 45 U/L (ref 35–117)
Anion Gap: 8 mmol/L (ref 2–17)
BUN: 17 mg/dL (ref 8–23)
CALCIUM,CORRECTED,CCA: 9 mg/dL (ref 8.8–10.2)
CO2: 29 mmol/L (ref 22–29)
Calcium: 8.1 mg/dL — ABNORMAL LOW (ref 8.8–10.2)
Chloride: 101 mmol/L (ref 98–107)
Creatinine: 0.4 mg/dL — ABNORMAL LOW (ref 0.5–1.0)
GFR African American: 119 mL/min/{1.73_m2} (ref >=90)
GFR Non-African American: 103 mL/min/{1.73_m2} (ref >=90)
Globulin: 2 g/dL (ref 1.9–4.4)
Glucose: 102 mg/dL — ABNORMAL HIGH (ref 70–99)
OSMOLALITY CALCULATED: 277 mOsm/kg (ref 270–287)
Potassium: 3.3 mmol/L — ABNORMAL LOW (ref 3.5–5.3)
Sodium: 138 mmol/L (ref 135–145)
Total Bilirubin: 0.86 mg/dL (ref 0.00–1.20)
Total Protein: 4.9 g/dL — ABNORMAL LOW (ref 6.4–8.3)

## 2020-06-12 LAB — DIFFERENTIAL, MANUAL
Absolute Lymph #: 0.4 10*3/uL — ABNORMAL LOW (ref 1.0–3.2)
Eosinophils %: 4 % (ref 0–7)
Lymphocytes: 62 % — ABNORMAL HIGH (ref 15–45)
Monocytes: 6 % (ref 4–12)
Neutrophils %. Manual count: 28 % — ABNORMAL LOW (ref 42–74)
Neutrophils Absolute: 0.2 10*3/uL — ABNORMAL LOW (ref 1.6–7.3)
Platelet Estimate: DECREASED — AB
RBC Morphology: ABNORMAL — AB

## 2020-06-12 LAB — IMMATURE CELLS
IMMATURE PLT ABSOLUTE: 2.7 10*3/uL
IMMATURE PLT PERCENT: 5.2 % (ref 1.2–8.6)

## 2020-06-12 LAB — ABO/RH
ABO/Rh: B NEG
ABO/Rh: B NEG

## 2020-06-12 LAB — PHOSPHORUS: Phosphorus: 3.6 mg/dL (ref 2.5–4.5)

## 2020-06-12 LAB — CBC WITH AUTO DIFFERENTIAL
Hematocrit: 20.7 % — CL (ref 34.0–47.0)
Hemoglobin: 7 g/dL — CL (ref 11.5–15.7)
MCH: 35.5 pg — ABNORMAL HIGH (ref 27.0–34.5)
MCHC: 33.8 g/dL (ref 32.0–36.0)
MCV: 105.1 fL — ABNORMAL HIGH (ref 81.0–99.0)
MPV: 11.2 fL (ref 7.2–13.2)
NRBC Absolute: 0.02 10*3/uL — ABNORMAL HIGH (ref 0.000–0.012)
NRBC Automated: 3.4 % — ABNORMAL HIGH (ref 0.0–0.2)
Platelets: 52 10*3/uL — ABNORMAL LOW (ref 140–440)
RBC: 1.97 x10e6/mcL — ABNORMAL LOW (ref 3.60–5.20)
RDW: 18.6 % — ABNORMAL HIGH (ref 11.0–16.0)
WBC: 0.6 10*3/uL — CL (ref 3.8–10.6)

## 2020-06-12 LAB — URIC ACID: Uric Acid: 2.2 mg/dL — ABNORMAL LOW (ref 2.4–5.7)

## 2020-06-12 LAB — ANTIBODY SCREEN: Antibody Screen: NEGATIVE

## 2020-06-12 LAB — LACTATE DEHYDROGENASE: LD: 966 U/L — ABNORMAL HIGH (ref 135–214)

## 2020-06-12 LAB — CULTURE, BLOOD 1

## 2020-06-12 NOTE — Progress Notes (Signed)
 Chaplaincy Note - Text       Chaplaincy Note Entered On:  06/12/2020 11:29 EST    Performed On:  06/12/2020 11:20 EST by Jamse Ao               Chaplaincy Consult   Faith/Denomination :   Sherlean Jamse Ao - 06/12/2020 11:20 EST   Follow-Up   Chaplaincy Follow-Up Visit Notes :   Chaplain checked in with the pt and her husband. She was more awake and energetic today. Her husband continues to stay round the clock and helps lovingly care for his wife. Pt shared how many groups of people are praying for her. Her husband talked about the close relationship the pt has with God. The pt said again, I know where I am going and said she is not fearful of death. I offered prayer for her healing. Pt was appreciative of the visit. PC will continue to follow.     Jamse Ao - 06/12/2020 11:20 EST

## 2020-06-12 NOTE — Progress Notes (Signed)
PT Time Spent with Patient Acute/OP-Text       PT Time Spent With Patient Outpatient/Acute Entered On:  06/12/2020 9:43 EST    Performed On:  06/12/2020 8:46 EST by Neva Seat, PT, Layla Maw               Time Spent With Patient   PT Treatment Time Comment :   Per RN, pt would like to eat right now and would prefer PT to return at a later time. Will continue to follow later this date as tolerated - RN confirming pt anemic this date, though, not sure if she will recieve a transfusion at this time.     PT Functional Training Time :   0 minutes   PT Total Timed Code Min :   0    PT Total Treatment Time Acute/OP :   0    Fuss, PT, Layla Maw - 06/12/2020 9:42 EST

## 2020-06-12 NOTE — Progress Notes (Signed)
 Inpatient PT Daily Documentation - Text       Inpatient PT Daily Documentation Entered On:  06/12/2020 15:32 EST    Performed On:  06/12/2020 14:08 EST by Fuss, PT, Josette SAILOR               Reason for Treatment   Subjective Statement :   RN and pt in agreement with PT tx. Pt reporting no pain throughout the session, though, slight soreness to her LUE. Pt left semi-supine in bed with call bell and all needs within reach, B heels floating, L UE elevated on pillows, blood transfusion and IV intact to R UE PICC, bed alarm intact, and husband present/RN aware with no signs of distress.     *Reason for Referral :   PT consult - falls risk, IV, R UE Limb alert, high risk hazardous drug precautions, blood transfusion    06/04/20 - pt with hx of polycythemia vera, presenting with acute thrombocytopenia, acute anemia and a multitude of constitutional symptoms concerning for leukemia. 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   06/05/20 - induction chemotherapy initiated; L UE PICC  06/11/20 - L UE swelling. B UE vascular studies; L UE minimum of occlusive peri-catheter thrombus in the Basilic vein immediately above the PICC insertion - negative for acute DVT.  06/12/20 - L UE PICC removed, R UE PICC placed    PMH: polycythemia vera, CVA, PE, seizure disorder, breast cancer, shingles    PLOF: Pt reporting she lives with her husband in a condo with elevator access. They also have a home in [place] , however, plan to remain here after being discharged. Pt is IND at baseline without AD. She denies falls, though, endorses that since the end of January, she's presented with low appetite and decreased energy overall. Pt owns a tub shower with shower stool, but no other DME. She and her husband used to enjoy golfing, however, she has macular degeneration and has a difficult time tracking the ball now.       Fuss, PT, Josette SAILOR - 06/12/2020 15:14 EST    Review/Treatments Provided   PT Goals :   PT Short Term Goals    06/11/2020  Other PT Goal #1: Pt will perform sup <> sit with HOB flat, no bedrails and SBA within 7 tx.; Initial  Other PT Goal #2: Pt will perform sit <> stand with LRAD/no AD and SBA within 7 tx.; Initial  Other PT Goal #3: Pt will perform static and dynamic standing balance activities with SBA and no LOB x 5 min within 7 tx.; Initial  Other PT Goal #4: Pt will perform stand pivot transfer with LRAD/no AD and SBA within 7 tx.; Initial  Other PT Goal #5: Pt will amb x 196ft with LRAD/no AD and SBA without rest breaks or LOB within 7 tx.; Initial  Other PT Goal #6: Pt will demo IND with gross B LE HEP within 7 tx.; Initial     PT Plan :   Treatment Duration: 2 Performed By: Dyke ALMETA Josette SAILOR  06/11/2020 08:42  Planned Treatments: Balance training, Basic activities of daily living, Bed mobility training, Equipment training, Functional training, Gait training, Posture/Body mechanics training, Patient education, Therapeutic activities, Therapeutic exercises, Transfer training Performed By: Dyke ALMETA Josette SAILOR  06/11/2020 08:42     Short Term Goals Reviewed :   Yes   Long Term Goals Reviewed :   Yes  Physical Therapy Orders :   Physical Therapy Additional Treatment Acute - 06/11/20 15:11:16 EST, Balance training, Basic activities of daily living, Bed mobility training, Equipment training, Functional training, Gait training, Posture/Body mechanics training, Patient education, Therapeutic activities, Therapeutic exercises...     Fuss, PT, Josette SAILOR - 06/12/2020 15:14 EST   Pain Assessment   Pain Present :   No actual or suspected pain   Fuss, PT, Josette SAILOR - 06/12/2020 15:14 EST   Therapeutic Activities/Mobility/Balance   PT Therapeutic Activities Acute/OT Grid     Activity 1  Activity 2  Activity 3      Activity :    Other: sit <> stand with FWW   Other: Gait training with FWW   Other: sit > sup        Response :    Tolerated well   Tolerated well    Tolerated well        Completed :    Yes   Yes   Yes        Comment :    performed with FWW and PT CGA. Pt with poor B hand placement, pulling up on walker. Inc. cueing for hand placement and sequencing   Pt amb x 422ft with FWW and demo reciprocal gait, erect posture, no overt buckling or LOB and no change in symptoms.    sit > supine with SBA          Fuss, PT, Kristen N - 06/12/2020 15:14 EST  Fuss, PT, Josette SAILOR - 06/12/2020 15:14 EST  Fuss, PT, Josette SAILOR - 06/12/2020 15:14 EST       Therapeutic Exercise   PT Therapeutic Exercise Acute/OT Grid     Exercise 1          Exercise :    Other: B LE HEP              Repetition/Time :    10 ea              Response :    Tolerated well              Completed :    Yes              Comment :    - B ankle pumps  - B SLR  - B glute sets                Fuss, PT, Josette SAILOR - 06/12/2020 15:14 EST         Short Term Goals   PT ST Goals Reviewed :   Yes   Fuss, PT, Josette SAILOR - 06/12/2020 15:14 EST   Other PT Goals Grid     Goal #1  Goal #2  Goal #3  Goal #4    Goal :    Pt will perform sup <> sit with HOB flat, no bedrails and SBA within 7 tx.   Pt will perform sit <> stand with LRAD/no AD and SBA within 7 tx.   Pt will perform static and dynamic standing balance activities with SBA and no LOB x 5 min within 7 tx.   Pt will perform stand pivot transfer with LRAD/no AD and SBA within 7 tx.     Status :    Progressing, continue   Progressing, continue   Progressing, continue   Progressing, continue     Comments :  Fuss, PT, Josette SAILOR - 06/12/2020 15:14 EST  Fuss, PT, Josette SAILOR - 06/12/2020 15:14 EST  Fuss, PT, Josette SAILOR - 06/12/2020 15:14 EST  Fuss, PT, Josette SAILOR - 06/12/2020 15:14 EST        Goal #5  Goal #6        Goal :    Pt will amb x 463ft with LRAD/no AD and SBA without rest breaks or LOB within 7 tx.   Pt will demo IND with gross B LE HEP within 7 tx.           Status :    Revised   Progressing, continue           Comments :    06/12/20 - revised to allow continued  progression                Fuss, PT, Kristen N - 06/12/2020 15:14 EST  Fuss, PT, Josette SAILOR - 06/12/2020 15:14 EST        Long Term Goals   PT LT Goals Reviewed :   Yes   PT Patient,Caregiver Goal :   to return home safely   Fuss, PT, Kristen N - 06/12/2020 15:14 EST   Outpatient PT Long Term Goals Rehab     Long Term Goal 1          Goal :    Pt will amb x 462ft without AD and with distant SBA without LOB within 14 tx.              Status :    Initial                Fuss, PT, Josette SAILOR - 06/12/2020 15:14 EST         Mobility Goals Grid   Bed, Chair, Wheelchair Goal :   Modified independence - 6   Ambulation Level Surfaces Goal :   Modified independence - 6   Fuss, PT, Josette SAILOR - 06/12/2020 15:14 EST   Mode of Locomotion Goal :   Walk   Fuss, PT, Kristen N - 06/12/2020 15:14 EST   Vital Signs   Peripheral Pulse Rate :   94 bpm   Systolic/ Diastolic  BP :   833 mmHg (HI)    Diastolic Blood Pressure :   91 mmHg (HI)    O2 Saturation :   96 %   O2 Therapy :   Room air   Additional Information :   Pt asymptomatic throughout the session on RA   Fuss, PT, Kristen N - 06/12/2020 15:14 EST   AM-PAC Basic Mobility   AM-PAC Basic Mobility   Turning from your back on your side while in a flat bed without using bedrails? :   A little = 3 points   Moving from lying on your back to sitting on the side of a flat bed without using bedrails? :   A little = 3 points   Moving to and from a bed to a chair (including a wheelchair)? :   A little = 3 points   Standing up from a chair using your arms (e.g. wheelchair or bedside chair)? :   A little = 3 points   To walk in hospital room? :   A little = 3 points   Fuss, PT, Josette SAILOR - 06/12/2020 15:14 EST   AM-PAC Mobility Raw Score :   15    Fuss, PT, Kristen N - 06/12/2020 15:14 EST  Assessment   PT Impairments or Limitations :   Ambulation deficits, Balance deficits, Bed mobility deficits, Endurance deficits, Equipment training, Safety awareness deficits, Strength deficits, Transfer deficits,  Transition deficits, Visual perceptual deficits   Barriers to Safe Discharge PT :   Limited family support, Safety awareness   Discharge Recommendations :   PT with continued assessment between rehab vs. return home with increased family support, AD as further identified and HHPT pending clinical course and pt's ability to tolerate progression towards goals. If pt does return home, she will likely require an assistive device, 3-in-1 BSC and a tub transfer bench. Will continue to assess.    06/12/20 - pt progressing well towards goals. Anticipate d/c home at this time with increased family support, FWW, tub transfer bench and 3-in-1 BSC. Will continue to assess as pt progresses towards goals.     D/CTransportation Recommendations :   Stretcher/Requires skilled assist for transfer   D/C Transportation Recommendations Reviewed :   Yes   PT Treatment Recommendations :   #1  ASSESSMENT: Pt with improved activity tolerance at this time. Of note, her L UE PICC was removed this date with placement of R UE PICC and she continues to present with L UE swelling - PT edu about relying on L UE grasp/release and elevation to improve swelling management. Additionally, pt anemic, however, blood transfusion remained intact and she was asymptomatic throuhgout all mobility. She was able to perform all mobility with SBA/CGA and tolerated 429ft of ambulation with FWW and no overt buckling or LOB. PT did initiate B LE HEP this date and will continue to progress as pt can tolerate. At this time, pt appropriate to continue ambulating with nursing staff and FWW multiple times daily as tolerated and pt will likely continue to benefit from skilled IPPT to address deficits, improve balance and stability, progress assistive devices as appropriate and to maximize safety at d/c.     Fuss, PT, Josette SAILOR - 06/12/2020 15:14 EST   Time Spent With Patient   PT Time In :   14:08 EST   PT Time Out :   14:38 EST   PT Functional Training Units :   2 units    PT Functional Training Time :   30 minutes   PT Total Timed Code Treatment Units :   2 units   PT Total Timed Code Min :   30    PT Total Treatment Time Acute/OP :   30    Fuss, PT, Josette SAILOR - 06/12/2020 15:14 EST

## 2020-06-12 NOTE — Nursing Note (Signed)
Vascular Access Nurse Note               PT. WITH LEFT THROMBUS NOTED AROUND PICC PREVIOUSLY PLACED, NO ACUTE DVT NOTED, BUT PATIENT WITH SOME LEFT HAND SWELLING NOTED AS 2-3+ PITTING EDEMA.  (SEE DOPPLER STUDY DOCUMENTATION)  DISCUSSED PLACING PICC IN RIGHT ARM WITH DR. Durwin Reges (PATIENT PREVIOULSY STATED SHE'D HAD LYMPH NODES REMOVED, INCLUDING SENTINAL NODE S/P RIGHT BREAST CA), INCLUDING INCREASED RISK FOR DVT IN RIGHT ARM ETC. DR. Durwin Reges RELAYED THAT THE PICC SHOULD BE PLACED IN THE RIGHT ARM AND THE LEFT PICC REMOVED. ORDER WAS PLACED SPECIFICALLY FOR RIGHT LIMB ALERT TO BE REMOVED PER MD. I DID RECOMMEND A DUAL PICC BE PLACED TO DECREASE CATHETER TO VEIN RATIO AND THEREFORE DECREASE RISK OF THROMBUS FURTHER (PATIENT ALSO NOW ANTICOAGULATED WITH LOVENOX). MD OK WITH DUAL PICC PLACEMENT AT THIS TIME ALTHOUGH ORDER REQUEST WAS FOR TRIPLE LUMEN.    ULTRASOUND GUIDED PICC LINE PLACEMENT    INDICATION: CHEMOTHERAPY    PROCEDURE PERFORMED BY Valda Favia, RN [facility]    PROCEDURE:    After informed, written consent, A preprocedure timeout was performed.    Arm circumference at insertion site was 26cm.  The patient's RIGHT UPPER ARM was prepped and draped  in sterile fashion.   Local anesthesia was obtained with 0.5cc 1% lidocaine intradermally. Using ultrasound guidance, the BASILIC vein  was accessed using micropuncture technique x1 attempt.  A guide wire  was introduced into the needle.  The needle was removed, a 5.5 French peel-away sheath was placed over the wire   and then the wire was removed.  A 35 cm, DUAL lumen catheter (ARROW lot# [id number]) was advanced through the peel-away sheath and the sheath was removed.   The tip of the catheter terminates in the SVC/CAJ per vascular positioning system, utilizing the teleflex guide wire.   The catheter functions well and is ready for use. A biopatch was placed around the catheter and was secured to the skin with a stat lock.   A sterile dressing was applied.  Each catheter was  flushed with 68ml normal saline. No Complications.      PROCEDURE START TIME: 1054    PROCEDURE END TIME:  1112    PATIENT TOLERATED WELL    Signature Line

## 2020-06-13 LAB — MORPHOLOGY CHECK
Platelet Estimate: DECREASED — AB
RBC Morphology: ABNORMAL — AB

## 2020-06-13 LAB — COMPREHENSIVE METABOLIC PANEL
ALT: 21 U/L (ref 0–33)
AST: 26 U/L (ref 0–32)
Albumin/Globulin Ratio: 1.4 mmol/L (ref 1.00–2.70)
Albumin: 3 g/dL — ABNORMAL LOW (ref 3.5–5.2)
Alk Phosphatase: 47 U/L (ref 35–117)
Anion Gap: 8 mmol/L (ref 2–17)
BUN: 17 mg/dL (ref 8–23)
CALCIUM,CORRECTED,CCA: 9.1 mg/dL (ref 8.8–10.2)
CO2: 30 mmol/L — ABNORMAL HIGH (ref 22–29)
Calcium: 8.3 mg/dL — ABNORMAL LOW (ref 8.8–10.2)
Chloride: 96 mmol/L — ABNORMAL LOW (ref 98–107)
Creatinine: 0.4 mg/dL — ABNORMAL LOW (ref 0.5–1.0)
GFR African American: 119 mL/min/{1.73_m2} (ref >=90)
GFR Non-African American: 103 mL/min/{1.73_m2} (ref >=90)
Globulin: 2.1 g/dL (ref 1.9–4.4)
Glucose: 97 mg/dL (ref 70–99)
OSMOLALITY CALCULATED: 269 mOsm/kg — ABNORMAL LOW (ref 270–287)
Potassium: 3.5 mmol/L (ref 3.5–5.3)
Sodium: 134 mmol/L — ABNORMAL LOW (ref 135–145)
Total Bilirubin: 0.83 mg/dL (ref 0.00–1.20)
Total Protein: 5.1 g/dL — ABNORMAL LOW (ref 6.4–8.3)

## 2020-06-13 LAB — CBC WITH AUTO DIFFERENTIAL
Absolute Baso #: 0 10*3/uL (ref 0.0–0.2)
Absolute Eos #: 0 10*3/uL (ref 0.0–0.5)
Absolute Lymph #: 0.3 10*3/uL — ABNORMAL LOW (ref 1.0–3.2)
Absolute Mono #: 0 10*3/uL — ABNORMAL LOW (ref 0.3–1.0)
Basophils %: 0 % (ref 0.0–2.0)
Eosinophils %: 0 % (ref 0.0–7.0)
Hematocrit: 26.3 % — ABNORMAL LOW (ref 34.0–47.0)
Hemoglobin: 9.2 g/dL — ABNORMAL LOW (ref 11.5–15.7)
Immature Grans (Abs): 0.02 10*3/uL (ref 0.00–0.06)
Immature Granulocytes: 4.2 % — ABNORMAL HIGH (ref 0.0–0.6)
Lymphocytes: 64.6 % — ABNORMAL HIGH (ref 15.0–45.0)
MCH: 34.3 pg (ref 27.0–34.5)
MCHC: 35 g/dL (ref 32.0–36.0)
MCV: 98.1 fL (ref 81.0–99.0)
MPV: 12.5 fL (ref 7.2–13.2)
Monocytes: 4.2 % (ref 4.0–12.0)
NRBC Absolute: 0 10*3/uL (ref 0.000–0.012)
NRBC Automated: 0 % (ref 0.0–0.2)
Neutrophils %: 27 % — ABNORMAL LOW (ref 42.0–74.0)
Neutrophils Absolute: 0.1 10*3/uL — ABNORMAL LOW (ref 1.6–7.3)
Platelets: 38 10*3/uL — ABNORMAL LOW (ref 140–440)
RBC: 2.68 x10e6/mcL — ABNORMAL LOW (ref 3.60–5.20)
RDW: 20 % — ABNORMAL HIGH (ref 11.0–16.0)
WBC: 0.5 10*3/uL — CL (ref 3.8–10.6)

## 2020-06-13 LAB — POTASSIUM
Potassium: 3.7 mmol/L (ref 3.5–5.3)
Potassium: 3.9 mmol/L (ref 3.5–5.3)

## 2020-06-13 LAB — LACTATE DEHYDROGENASE: LD: 994 U/L — ABNORMAL HIGH (ref 135–214)

## 2020-06-13 LAB — IMMATURE CELLS
IMMATURE PLT ABSOLUTE: 1.4 10*3/uL
IMMATURE PLT PERCENT: 3.6 % (ref 1.2–8.6)

## 2020-06-13 LAB — PHOSPHORUS: Phosphorus: 3.1 mg/dL (ref 2.5–4.5)

## 2020-06-13 LAB — URIC ACID: Uric Acid: 1.8 mg/dL — ABNORMAL LOW (ref 2.4–5.7)

## 2020-06-13 NOTE — Progress Notes (Signed)
 Inpatient PT Daily Documentation - Text       Inpatient PT Daily Documentation Entered On:  06/13/2020 14:22 EST    Performed On:  06/13/2020 10:29 EST by Lauren Madden               Reason for Treatment   Subjective Statement :   Pt agreeable to PT session  RN also in agreement.  Tx #2     Lauren Madden - 06/13/2020 15:55 EST     *Reason for Referral :   PT consult - falls risk, IV, R UE Limb alert, high risk hazardous drug precautions, blood transfusion    06/04/20 - pt with hx of polycythemia vera, presenting with acute thrombocytopenia, acute anemia and a multitude of constitutional symptoms concerning for leukemia. 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   06/05/20 - induction chemotherapy initiated; L UE PICC  06/11/20 - L UE swelling. B UE vascular studies; L UE minimum of occlusive peri-catheter thrombus in the Basilic vein immediately above the PICC insertion - negative for acute DVT.  06/12/20 - L UE PICC removed, R UE PICC placed    PMH: polycythemia vera, CVA, PE, seizure disorder, breast cancer, shingles    PLOF: Pt reporting she lives with her husband in a condo with elevator access. They also have a home in [place] , however, plan to remain here after being discharged. Pt is IND at baseline without AD. She denies falls, though, endorses that since the end of January, she's presented with low appetite and decreased energy overall. Pt owns a tub shower with shower stool, but no other DME. She and her husband used to enjoy golfing, however, she has macular degeneration and has a difficult time tracking the ball now.       *Chief Complaint :   dec endurance     Lauren Madden - 06/13/2020 14:17 EST   Review/Treatments Provided   PT Goals :   PT Short Term Goals    06/12/2020  Other PT Goal #1: Pt will perform sup <> sit with HOB flat, no bedrails and SBA within 7 tx.; Progressing, continue  Other PT Goal #2: Pt will perform  sit <> stand with LRAD/no AD and SBA within 7 tx.; Progressing, continue  Other PT Goal #3: Pt will perform static and dynamic standing balance activities with SBA and no LOB x 5 min within 7 tx.; Progressing, continue  Other PT Goal #4: Pt will perform stand pivot transfer with LRAD/no AD and SBA within 7 tx.; Progressing, continue  Other PT Goal #5: Pt will amb x 440ft with LRAD/no AD and SBA without rest breaks or LOB within 7 tx.; Revised; 06/12/20 - revised to allow continued progression  Other PT Goal #6: Pt will demo IND with gross B LE HEP within 7 tx.; Progressing, continue     PT Plan :   Treatment Duration: 2 Performed By: Lauren Madden  06/11/2020 08:42  Planned Treatments: Balance training, Basic activities of daily living, Bed mobility training, Equipment training, Functional training, Gait training, Posture/Body mechanics training, Patient education, Therapeutic activities, Therapeutic exercises, Transfer training Performed By: Lauren Madden  06/11/2020 08:42     Physical Therapy Orders :   Physical Therapy Additional Treatment Acute - 06/11/20 15:11:16 EST, Balance training, Basic activities of daily living, Bed mobility training, Equipment training, Functional training, Gait training, Posture/Body mechanics training, Patient  education, Therapeutic activities, Therapeutic exercises...     Lauren Madden - 06/13/2020 14:17 EST   Pain Assessment   Pain Present :   No actual or suspected pain   Lauren Madden - 06/13/2020 14:17 EST   Therapeutic Activities/Mobility/Balance   PT Therapeutic Activities Acute/OT Grid     Activity 1  Activity 2  Activity 3      Activity :    Other: sit <> stand with FWW   Other: Gait training with FWW   Other: sit > sup        Response :    Tolerated well   Tolerated well   Tolerated well        Completed :    Yes   Yes   Yes        Comment :    performed with FWW and PT CGA. Pt with improved B hand placement, pushing from recliner without cues and able to reach  back to recliner without cues   Pt amb x 864ft with FWW and demo reciprocal gait, erect posture, no overt buckling or LOB and no change in symptoms. Pt's husband managed IV pole; CGA from therapist. Pt cued on turning with RW due to pt picking up RW to turn   NC: sit > supine with SBA          Lauren Madden - 06/13/2020 14:17 EST  Lauren Madden - 06/13/2020 14:17 EST  Lauren Madden - 06/13/2020 14:17 EST       Education   Home Caregiver Name/Relationship :   Lael Lauren Madden - 06/13/2020 14:17 EST   Physical Therapy Education Grid   Ambulation with Carlis Vannie :   Verbalizes understanding, Needs practice/supervision   Lauren Madden - 06/13/2020 14:17 EST   Assessment   PT Impairments or Limitations :   Ambulation deficits, Balance deficits, Bed mobility deficits, Endurance deficits, Equipment training, Safety awareness deficits, Strength deficits, Transfer deficits, Transition deficits, Visual perceptual deficits   Barriers to Safe Discharge PT :   Limited family support, Safety awareness   Discharge Recommendations :   PT with continued assessment between rehab vs. return home with increased family support, AD as further identified and HHPT pending clinical course and pt's ability to tolerate progression towards goals. If pt does return home, she will likely require an assistive device, 3-in-1 BSC and a tub transfer bench. Will continue to assess.    06/12/20 - pt progressing well towards goals. Anticipate d/c home at this time with increased family support, FWW, tub transfer bench and 3-in-1 BSC. Will continue to assess as pt progresses towards goals.     D/CTransportation Recommendations :   Stretcher/Requires skilled assist for transfer   D/C Transportation Recommendations Reviewed :   Yes   Lauren Madden - 06/13/2020 14:17 EST   PT Treatment Recommendations :   tx #2 Pt progressing well with functional mobility this session, pt able to complete xfers and ambulation with RW, CGA and with decreased  cues for safety for xfers. Pt progressed with endurance, ambulated on unit multiple laps without rest break and pt's husband present for IV pole management and encouragement. Pt is motivated and in good spirits, continue PT POC to maximize her functional endurance and mobility. Pt returned to recliner with all needs within reach and RN in room.     Lauren Madden - 06/13/2020 15:55 EST  Time Spent With Patient   PT Time In :   10:29 EST   PT Time Out :   11:00 EST   PT Gait Training Units :   2 units   PT Gait Training Time :   31 minutes   PT Total Timed Code Treatment Units :   2 units   PT Total Timed Code Min :   31    PT Total Treatment Time Acute/OP :   3    Lauren Madden - 06/13/2020 14:17 EST

## 2020-06-13 NOTE — Progress Notes (Signed)
Pharmacy Progress Note               Pharmacy Progress Note      Vancomycin consult for possible cellulitis.  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  PMH is significant for CVA, PE and seizures.    wt= 66.4kg  eCrCl: 50ml/min    Labs (Last four charted values)  WBC                  C 0.5 (FEB 23) C 0.6 (FEB 22) C 0.9 (FEB 21) C 1.2 (FEB 20)   Cr                   L 0.4 (FEB 23) L 0.4 (FEB 22) L 0.4 (FEB 21) L 0.4 (FEB 20)   BUN                  17 (FEB 23) 17 (FEB 22) 18 (FEB 21) 18 (FEB 20)       Vital Signs (last 24 hrs)_____  Last Charted___________  Temp Oral     36.5 degC  (FEB 23 11:55)  Heart Rate Peripheral   84 bpm  (FEB 23 04:00)  Resp Rate         18 br/min  (FEB 23 11:55)  SBP      131 mmHg  (FEB 23 11:55)  DBP      81 mmHg  (FEB 23 11:55)  SpO2      98 %  (FEB 23 11:55)      Assessment/Plan:  1.  Patient received  Vancomycin 1500mg  loading dose @ 948 this am. Per will start vancomycin 1000mg  IV q12hrs.  This should provide the following:   Exposure target: AUC24 (range)400-600 mg/L.hr   AUC24,ss: 429 mg/L.hr  Probability of AUC24 > 400: 57 %  Ctrough,ss: 12.1 mg/L  Probability of Ctrough,ss > 20: 16 %  Probability of nephrotoxicity (Lodise CID 2009): 7 %    2. Will draw random level 2/24 to verify above calculations and adjust if needed.  3. Pharmacy to follow daily.    2010, PharmD    3/24     Electronically Signed on 06/13/2020 01:07 PM EST   ________________________________________________   [employer] D., JENNIFER N

## 2020-06-14 LAB — COMPREHENSIVE METABOLIC PANEL
ALT: 24 U/L (ref 0–33)
AST: 28 U/L (ref 0–32)
Albumin/Globulin Ratio: 1.3 mmol/L (ref 1.00–2.70)
Albumin: 3.2 g/dL — ABNORMAL LOW (ref 3.5–5.2)
Alk Phosphatase: 51 U/L (ref 35–117)
Anion Gap: 8 mmol/L (ref 2–17)
BUN: 15 mg/dL (ref 8–23)
CALCIUM,CORRECTED,CCA: 8.9 mg/dL (ref 8.8–10.2)
CO2: 28 mmol/L (ref 22–29)
Calcium: 8.3 mg/dL — ABNORMAL LOW (ref 8.8–10.2)
Chloride: 102 mmol/L (ref 98–107)
Creatinine: 0.3 mg/dL — ABNORMAL LOW (ref 0.5–1.0)
GFR African American: 131 mL/min/{1.73_m2} (ref >=90)
GFR Non-African American: 113 mL/min/{1.73_m2} (ref >=90)
Globulin: 2.4 g/dL (ref 1.9–4.4)
Glucose: 88 mg/dL (ref 70–99)
OSMOLALITY CALCULATED: 276 mOsm/kg (ref 270–287)
Potassium: 4 mmol/L (ref 3.5–5.3)
Sodium: 138 mmol/L (ref 135–145)
Total Bilirubin: 0.8 mg/dL (ref 0.00–1.20)
Total Protein: 5.6 g/dL — ABNORMAL LOW (ref 6.4–8.3)

## 2020-06-14 LAB — CBC WITH AUTO DIFFERENTIAL
Absolute Baso #: 0 10*3/uL (ref 0.0–0.2)
Absolute Eos #: 0 10*3/uL (ref 0.0–0.5)
Absolute Lymph #: 0.4 10*3/uL — ABNORMAL LOW (ref 1.0–3.2)
Absolute Mono #: 0 10*3/uL — ABNORMAL LOW (ref 0.3–1.0)
Basophils %: 0 % (ref 0.0–2.0)
Eosinophils %: 0 % (ref 0.0–7.0)
Hematocrit: 27.7 % — ABNORMAL LOW (ref 34.0–47.0)
Hemoglobin: 9.6 g/dL — ABNORMAL LOW (ref 11.5–15.7)
Immature Grans (Abs): 0.03 10*3/uL (ref 0.00–0.06)
Immature Granulocytes: 5.7 % — ABNORMAL HIGH (ref 0.0–0.6)
Lymphocytes: 71.7 % — ABNORMAL HIGH (ref 15.0–45.0)
MCH: 34.4 pg (ref 27.0–34.5)
MCHC: 34.7 g/dL (ref 32.0–36.0)
MCV: 99.3 fL — ABNORMAL HIGH (ref 81.0–99.0)
MPV: 11.9 fL (ref 7.2–13.2)
Monocytes: 1.9 % — ABNORMAL LOW (ref 4.0–12.0)
NRBC Absolute: 0 10*3/uL (ref 0.000–0.012)
NRBC Automated: 0 % (ref 0.0–0.2)
Neutrophils %: 20.7 % — ABNORMAL LOW (ref 42.0–74.0)
Neutrophils Absolute: 0.1 10*3/uL — ABNORMAL LOW (ref 1.6–7.3)
Platelets: 29 10*3/uL — ABNORMAL LOW (ref 140–440)
RBC: 2.79 x10e6/mcL — ABNORMAL LOW (ref 3.60–5.20)
RDW: 19.1 % — ABNORMAL HIGH (ref 11.0–16.0)
WBC: 0.5 10*3/uL — CL (ref 3.8–10.6)

## 2020-06-14 LAB — IMMATURE CELLS
IMMATURE PLT ABSOLUTE: 0.7 10*3/uL
IMMATURE PLT PERCENT: 2.4 % (ref 1.2–8.6)

## 2020-06-14 LAB — URIC ACID: Uric Acid: 1.6 mg/dL — ABNORMAL LOW (ref 2.4–5.7)

## 2020-06-14 LAB — PHOSPHORUS: Phosphorus: 3.4 mg/dL (ref 2.5–4.5)

## 2020-06-14 LAB — LACTATE DEHYDROGENASE: LD: 905 U/L — ABNORMAL HIGH (ref 135–214)

## 2020-06-14 LAB — VANCOMYCIN LEVEL, RANDOM: Vancomycin Rm: 11.2 ug/mL (ref 10.0–40.0)

## 2020-06-14 NOTE — Case Communication (Signed)
CM Discharge Planning Assessment - Text       CM Progress Note Entered On:  06/14/2020 14:54 EST    Performed On:  06/14/2020 14:54 EST by Robynn Pane M-RN               CM Progress Note   CM Home/Lay Caregiver Name/Relationship :   Spouse:  Raha Tennison    [phone]   CM Initial Tentative Discharge Plan :   Home with services to be determined   Robynn Pane M-RN - 06/14/2020 14:54 EST   CM Progress Note :   Attempted CM assessment 06/05/2020 and 06/06/2020, will reattempt 06/07/2020.  BPaulson RN CM    06/07/2020:  Met with pt/spouse at bedside.  Pt lives with spouse:  Lauren Madden [phone].  Independent with ADLS, PTA.  DME:  Shower chair.  STE: 3.  Pt and spouse understand 'plan with Dr Bobette Mo and to be here several weeks for treatment.'  Pt and spouse aware that CM will continue to follow for discharge planning needs.  BPaulson RN    06/11/2020:  Reviewed chart:  Referred pt to [facility], per in-pt PT recomendation.  CM will continue to follow.  BPaulson RN CM    06/13/2020:  Met with pt at bedside.  P.T. has ordered 3-in-1 BSC and WW upon discharge.  CM notified Sherilyn Cooter NP for this request and will continue to follow for discharge planning needs.   BPaulson RN CM     Robynn Pane M-RN - 06/14/2020 15:04 EST

## 2020-06-14 NOTE — Progress Notes (Signed)
Pharmacy Progress Note               Pharmacy Progress Note      Vancomycin consult for possible cellulitis.  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  PMH is significant for CVA, PE and seizures.    Day: 2  Current dose: Vancomycin  1500mg  LD followed by 1g IV q12hrs  Vancomycin random level 11.2mg /l @ 0514 2/24 - doses given at 9/9    wt= 66.4kg  eCrCl: 51ml/min      Labs (Last four charted values)  WBC                  C 0.5 (FEB 24) C 0.5 (FEB 23) C 0.6 (FEB 22) C 0.9 (FEB 21)   Cr                   L 0.3 (FEB 24) L 0.4 (FEB 23) L 0.4 (FEB 22) L 0.4 (FEB 21)   BUN                  15 (FEB 24) 17 (FEB 23) 17 (FEB 22) 18 (FEB 21)       Vital Signs (last 24 hrs)_____  Last Charted___________  Temp Oral     36.6 degC  (FEB 24 11:38)  Heart Rate Peripheral   92 bpm  (FEB 24 11:38)  Resp Rate         18 br/min  (FEB 24 11:38)  SBP      H Feb 26  (FEB 24 11:38)  DBP      89 mmHg  (FEB 24 11:38)  SpO2      99 %  (FEB 24 11:38)      Assessment/Plan:  1. Adjustment with level this am- current dose now provides AUC24ss 348, trough conc 8.8mg /l and within AUC goal 26% of time.  Will increase to 1250mg  IV q12hrs. Per [facility] this should provide AUC24,ss: 434,   Ctrough,ss: 11.2 mg/L.  AUC goal 64% of time and risk of nephrotoxicity 8%.       2. Will continue to follow daily. Repeat levels within 3-4 days if renal function remains stable.     Thank you,  Feb 26, PharmD    Signature Line     Electronically Signed on 06/14/2020 03:03 PM EST   ________________________________________________   Lauren Lade. D., Lauren Madden

## 2020-06-14 NOTE — Progress Notes (Signed)
Inpatient PT Daily Documentation - Text       Inpatient PT Daily Documentation Entered On:  06/14/2020 11:43 EST    Performed On:  06/14/2020 10:30 EST by Eusebio Friendly Yi-PT               Reason for Treatment   Subjective Statement :   tx #3  Pt and RN in agreement to PT session. Pt reports she feels pretty good, denies pain.     *Reason for Referral :   PT consult - falls risk, IV, R UE Limb alert, high risk hazardous drug precautions, blood transfusion    06/04/20 - pt with hx of polycythemia vera, presenting with acute thrombocytopenia, acute anemia and a multitude of constitutional symptoms concerning for leukemia. 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   06/05/20 - induction chemotherapy initiated; L UE PICC  06/11/20 - L UE swelling. B UE vascular studies; L UE minimum of occlusive peri-catheter thrombus in the Basilic vein immediately above the PICC insertion - negative for acute DVT.  06/12/20 - L UE PICC removed, R UE PICC placed    PMH: polycythemia vera, CVA, PE, seizure disorder, breast cancer, shingles    PLOF: Pt reporting she lives with her husband in a condo with elevator access. They also have a home in [place], however, plan to remain here after being discharged. Pt is IND at baseline without AD. She denies falls, though, endorses that since the end of January, she's presented with low appetite and decreased energy overall. Pt owns a tub shower with shower stool, but no other DME. She and her husband used to enjoy golfing, however, she has macular degeneration and has a difficult time tracking the ball now.       *Chief Complaint :   dec endurance    Treatment #3     Eusebio Friendly Yi-PT - 06/14/2020 11:38 EST   Review/Treatments Provided   PT Goals :   PT Short Term Goals    06/12/2020  Other PT Goal #1: Pt will perform sup <> sit with HOB flat, no bedrails and SBA within 7 tx.; Progressing, continue  Other PT Goal #2: Pt will  perform sit <> stand with LRAD/no AD and SBA within 7 tx.; Progressing, continue  Other PT Goal #3: Pt will perform static and dynamic standing balance activities with SBA and no LOB x 5 min within 7 tx.; Progressing, continue  Other PT Goal #4: Pt will perform stand pivot transfer with LRAD/no AD and SBA within 7 tx.; Progressing, continue  Other PT Goal #5: Pt will amb x 450ft with LRAD/no AD and SBA without rest breaks or LOB within 7 tx.; Revised; 06/12/20 - revised to allow continued progression  Other PT Goal #6: Pt will demo IND with gross B LE HEP within 7 tx.; Progressing, continue     PT Plan :   Treatment Duration: 2 Performed By: Nehemiah Massed  06/11/2020 08:42  Planned Treatments: Balance training, Basic activities of daily living, Bed mobility training, Equipment training, Functional training, Gait training, Posture/Body mechanics training, Patient education, Therapeutic activities, Therapeutic exercises, Transfer training Performed By: Nehemiah Massed  06/11/2020 08:42     Physical Therapy Orders :   Physical Therapy Additional Treatment Acute - 06/11/20 15:11:16 EST, Balance training, Basic activities of daily living, Bed mobility training, Equipment training, Functional training, Gait training, Posture/Body mechanics training, Patient education,  Therapeutic activities, Therapeutic exercises...     Eusebio Friendly Yi-PT - 06/14/2020 11:38 EST   Pain Assessment   Pain Present :   No actual or suspected pain   Eusebio Friendly Yi-PT - 06/14/2020 11:38 EST   Therapeutic Activities/Mobility/Balance   PT Therapeutic Activities Acute/OT Grid     Activity 1  Activity 2  Activity 3      Activity :    Other: sit <> stand with FWW   Other: Gait training with FWW   Other: sit > sup        Response :    Tolerated well   Tolerated well   Tolerated well        Completed :    Yes   Yes   Yes        Comment :    Sit to/from stand from recliner, supervision; Pt with improved B hand placement, without cues this session.     Pt amb x 866ft total first 400 feet with FWW and demo reciprocal gait, erect posture, no overt buckling or LOB and no change in symptoms. IV disconnected by RN prior; CS by PT. Pt amb last 400 ft without AD and CGA, no LOB and demo'd reciprocal arm swing   NC: sit > supine with SBA          Eusebio Friendly Yi-PT - 06/14/2020 11:38 EST  Eusebio Friendly Yi-PT - 06/14/2020 11:38 EST  Eusebio Friendly Yi-PT - 06/14/2020 11:38 EST       Mobility   Mobility Grid   Transfer Sit to Stand :   Rehab Supervision   Transfer Stand to Sit :   Rehab Supervision   Eusebio Friendly Yi-PT - 06/14/2020 11:38 EST   Ambulation Level Acute :   Contact guard assistance   Ambulation Distance :   400 ft   Device :   None   Eusebio Friendly Yi-PT - 06/14/2020 11:38 EST   Education   Home Caregiver Name/Relationship :   Denman George - 06/14/2020 11:38 EST   Physical Therapy Education Grid   Ambulation :   TEFL teacher understanding, Needs practice/supervision   Ambulation with Roller Walker :   Verbalizes understanding, Demonstrates   Eusebio Friendly Yi-PT - 06/14/2020 11:38 EST   Assessment   PT Impairments or Limitations :   Ambulation deficits, Balance deficits, Bed mobility deficits, Endurance deficits, Equipment training, Safety awareness deficits, Strength deficits, Transfer deficits, Transition deficits, Visual perceptual deficits   Barriers to Safe Discharge PT :   Limited family support, Safety awareness   Discharge Recommendations :   PT with continued assessment between rehab vs. return home with increased family support, AD as further identified and HHPT pending clinical course and pt's ability to tolerate progression towards goals. If pt does return home, she will likely require an assistive device, 3-in-1 BSC and a tub transfer bench. Will continue to assess.    06/12/20 - pt progressing well towards goals. Anticipate d/c home at this time with increased family support, FWW, tub transfer bench and 3-in-1 BSC. Will continue to assess as pt progresses towards goals.      D/CTransportation Recommendations :   No stretcher   D/C Transportation Recommendations Reviewed :   Yes   PT Treatment Recommendations :   Pt progressing with functional mobility and endurance, able to amb longer distances with and without use of AD. Pt able to practice without AD  this session, no LOB noted but pt with slower but safe pacing. Pt in agreement to continue practicing without AD but to use RW with staff at this time. Progress PT POC while pt inpt. Pt returned to recliner with all needs in place and pt's husband present.     Eusebio Friendly Yi-PT - 06/14/2020 11:38 EST   Time Spent With Patient   PT Time In :   10:30 EST   PT Time Out :   10:54 EST   PT Gait Training Units :   2 units   PT Gait Training Time :   24 minutes   PT Total Timed Code Treatment Units :   2 units   PT Total Timed Code Min :   24    PT Total Treatment Time Acute/OP :   24    Eusebio Friendly Yi-PT - 06/14/2020 11:38 EST

## 2020-06-15 LAB — CBC WITH AUTO DIFFERENTIAL
Absolute Baso #: 0 10*3/uL (ref 0.0–0.2)
Absolute Eos #: 0 10*3/uL (ref 0.0–0.5)
Absolute Lymph #: 0.3 10*3/uL — ABNORMAL LOW (ref 1.0–3.2)
Absolute Mono #: 0 10*3/uL — ABNORMAL LOW (ref 0.3–1.0)
Basophils %: 0 % (ref 0.0–2.0)
Eosinophils %: 0 % (ref 0.0–7.0)
Hematocrit: 24.5 % — ABNORMAL LOW (ref 34.0–47.0)
Hemoglobin: 8.5 g/dL — ABNORMAL LOW (ref 11.5–15.7)
Immature Grans (Abs): 0 10*3/uL (ref 0.00–0.06)
Immature Granulocytes: 0 % (ref 0.0–0.6)
Lymphocytes: 70.7 % — ABNORMAL HIGH (ref 15.0–45.0)
MCH: 34.6 pg — ABNORMAL HIGH (ref 27.0–34.5)
MCHC: 34.7 g/dL (ref 32.0–36.0)
MCV: 99.6 fL — ABNORMAL HIGH (ref 81.0–99.0)
MPV: 13.1 fL (ref 7.2–13.2)
Monocytes: 4.9 % (ref 4.0–12.0)
NRBC Absolute: 0 10*3/uL (ref 0.000–0.012)
NRBC Automated: 0 % (ref 0.0–0.2)
Neutrophils %: 24.4 % — ABNORMAL LOW (ref 42.0–74.0)
Neutrophils Absolute: 0.1 10*3/uL — ABNORMAL LOW (ref 1.6–7.3)
Platelets: 18 10*3/uL — CL (ref 140–440)
RBC: 2.46 x10e6/mcL — ABNORMAL LOW (ref 3.60–5.20)
RDW: 18.5 % — ABNORMAL HIGH (ref 11.0–16.0)
WBC: 0.4 10*3/uL — CL (ref 3.8–10.6)

## 2020-06-15 LAB — COMPREHENSIVE METABOLIC PANEL
ALT: 29 U/L (ref 0–33)
AST: 28 U/L (ref 0–32)
Albumin/Globulin Ratio: 1.2 mmol/L (ref 1.00–2.70)
Albumin: 2.9 g/dL — ABNORMAL LOW (ref 3.5–5.2)
Alk Phosphatase: 51 U/L (ref 35–117)
Anion Gap: 10 mmol/L (ref 2–17)
BUN: 15 mg/dL (ref 8–23)
CALCIUM,CORRECTED,CCA: 9.4 mg/dL (ref 8.8–10.2)
CO2: 25 mmol/L (ref 22–29)
Calcium: 8.5 mg/dL — ABNORMAL LOW (ref 8.8–10.2)
Chloride: 102 mmol/L (ref 98–107)
Creatinine: 0.4 mg/dL — ABNORMAL LOW (ref 0.5–1.0)
GFR African American: 119 mL/min/{1.73_m2} (ref >=90)
GFR Non-African American: 103 mL/min/{1.73_m2} (ref >=90)
Globulin: 2.5 g/dL (ref 1.9–4.4)
Glucose: 92 mg/dL (ref 70–99)
OSMOLALITY CALCULATED: 274 mOsm/kg (ref 270–287)
Potassium: 3.6 mmol/L (ref 3.5–5.3)
Sodium: 137 mmol/L (ref 135–145)
Total Bilirubin: 0.62 mg/dL (ref 0.00–1.20)
Total Protein: 5.4 g/dL — ABNORMAL LOW (ref 6.4–8.3)

## 2020-06-15 LAB — LACTATE DEHYDROGENASE: LD: 747 U/L — ABNORMAL HIGH (ref 135–214)

## 2020-06-15 LAB — MISCELLANEOUS REFERENCE TEST: test code: 167454

## 2020-06-15 LAB — PHOSPHORUS: Phosphorus: 3.4 mg/dL (ref 2.5–4.5)

## 2020-06-15 LAB — URIC ACID: Uric Acid: 1.7 mg/dL — ABNORMAL LOW (ref 2.4–5.7)

## 2020-06-15 LAB — IMMATURE CELLS
IMMATURE PLT ABSOLUTE: 0.4 10*3/uL
IMMATURE PLT PERCENT: 2.3 % (ref 1.2–8.6)

## 2020-06-15 NOTE — Progress Notes (Signed)
Pharmacy Clinical Interventions - Text       Pharmacy Clinical Interventions Entered On:  06/15/2020 10:02 EST    Performed On:  06/15/2020 10:02 EST by Jeanell Sparrow.Altamease Oiler               Interventions   Intervention Type Pharmacy :   Consult-Pharmacokinetic   Clinical Importance Pharmacy :   Potentially minor   Medication Safety Reporting Pharmacy :   Non Medication Event   Pharmacist Intervention Time :   16-30 Minutes   [place], [place].Altamease Oiler - 06/15/2020 10:02 EST

## 2020-06-15 NOTE — Case Communication (Signed)
CM Discharge Planning Assessment - Text       CM Progress Note Entered On:  06/15/2020 15:54 EST    Performed On:  06/15/2020 15:53 EST by Robynn Pane M-RN               CM Progress Note   CM Home/Lay Caregiver Name/Relationship :   Spouse:  Lauren Madden    [phone]   CM Initial Tentative Discharge Plan :   Home with services to be determined   CM Progress Note :   Attempted CM assessment 06/05/2020 and 06/06/2020, will reattempt 06/07/2020.  BPaulson RN CM    06/07/2020:  Met with pt/spouse at bedside.  Pt lives with spouse:  Lauren Madden [phone].  Independent with ADLS, PTA.  DME:  Shower chair.  STE: 3.  Pt and spouse understand 'plan with Dr Bobette Mo and to be here several weeks for treatment.'  Pt and spouse aware that CM will continue to follow for discharge planning needs.  BPaulson RN    06/11/2020:  Reviewed chart:  Referred pt to [facility] PT, per in-pt PT recomendation.  CM will continue to follow.  BPaulson RN CM    06/13/2020:  Met with pt at bedside.  P.T. has ordered 3-in-1 BSC and WW upon discharge.  CM notified Sherilyn Cooter NP for this request and will continue to follow for discharge planning needs.   BPaulson RN CM    06/15/2020:  Reviewed chart.  Pt ambulating in hallway.  CM will follow up with pt on 06/18/2020 for ordering DME.   BPaulson RN CM     Robynn Pane M-RN - 06/15/2020 15:53 EST

## 2020-06-16 LAB — IMMATURE CELLS
IMMATURE PLT ABSOLUTE: 0.3 10*3/uL
IMMATURE PLT PERCENT: 0.8 % — ABNORMAL LOW (ref 1.2–8.6)

## 2020-06-16 LAB — COMPREHENSIVE METABOLIC PANEL
ALT: 31 U/L (ref 0–33)
AST: 27 U/L (ref 0–32)
Albumin/Globulin Ratio: 1.4 mmol/L (ref 1.00–2.70)
Albumin: 3.1 g/dL — ABNORMAL LOW (ref 3.5–5.2)
Alk Phosphatase: 56 U/L (ref 35–117)
Anion Gap: 7 mmol/L (ref 2–17)
BUN: 16 mg/dL (ref 8–23)
CALCIUM,CORRECTED,CCA: 9 mg/dL (ref 8.8–10.2)
CO2: 27 mmol/L (ref 22–29)
Calcium: 8.3 mg/dL — ABNORMAL LOW (ref 8.8–10.2)
Chloride: 105 mmol/L (ref 98–107)
Creatinine: 0.5 mg/dL (ref 0.5–1.0)
GFR African American: 110 mL/min/{1.73_m2} (ref >=90)
GFR Non-African American: 95 mL/min/{1.73_m2} (ref >=90)
Globulin: 2.2 g/dL (ref 1.9–4.4)
Glucose: 96 mg/dL (ref 70–99)
OSMOLALITY CALCULATED: 279 mOsm/kg (ref 270–287)
Potassium: 3.6 mmol/L (ref 3.5–5.3)
Sodium: 139 mmol/L (ref 135–145)
Total Bilirubin: 0.6 mg/dL (ref 0.00–1.20)
Total Protein: 5.3 g/dL — ABNORMAL LOW (ref 6.4–8.3)

## 2020-06-16 LAB — CBC WITH AUTO DIFFERENTIAL
Absolute Baso #: 0 10*3/uL (ref 0.0–0.2)
Absolute Eos #: 0 10*3/uL (ref 0.0–0.5)
Absolute Lymph #: 0.3 10*3/uL — ABNORMAL LOW (ref 1.0–3.2)
Absolute Mono #: 0 10*3/uL — ABNORMAL LOW (ref 0.3–1.0)
Basophils %: 0 % (ref 0.0–2.0)
Eosinophils %: 0 % (ref 0.0–7.0)
Hematocrit: 22.9 % — ABNORMAL LOW (ref 34.0–47.0)
Hemoglobin: 7.7 g/dL — ABNORMAL LOW (ref 11.5–15.7)
Immature Grans (Abs): 0.01 10*3/uL (ref 0.00–0.06)
Immature Granulocytes: 2.8 % — ABNORMAL HIGH (ref 0.0–0.6)
Lymphocytes: 75 % — ABNORMAL HIGH (ref 15.0–45.0)
MCH: 33.6 pg (ref 27.0–34.5)
MCHC: 33.6 g/dL (ref 32.0–36.0)
MCV: 100 fL — ABNORMAL HIGH (ref 81.0–99.0)
MPV: 9.3 fL (ref 7.2–13.2)
Monocytes: 5.6 % (ref 4.0–12.0)
NRBC Absolute: 0 10*3/uL (ref 0.000–0.012)
NRBC Automated: 0 % (ref 0.0–0.2)
Neutrophils %: 16.6 % — ABNORMAL LOW (ref 42.0–74.0)
Neutrophils Absolute: 0.1 10*3/uL — ABNORMAL LOW (ref 1.6–7.3)
Platelets: 34 10*3/uL — ABNORMAL LOW (ref 140–440)
RBC: 2.29 x10e6/mcL — ABNORMAL LOW (ref 3.60–5.20)
RDW: 18.1 % — ABNORMAL HIGH (ref 11.0–16.0)
WBC: 0.4 10*3/uL — CL (ref 3.8–10.6)

## 2020-06-16 LAB — LACTATE DEHYDROGENASE: LD: 643 U/L — ABNORMAL HIGH (ref 135–214)

## 2020-06-16 LAB — PHOSPHORUS: Phosphorus: 3.2 mg/dL (ref 2.5–4.5)

## 2020-06-17 LAB — COMPREHENSIVE METABOLIC PANEL
ALT: 32 U/L (ref 0–33)
AST: 28 U/L (ref 0–32)
Albumin/Globulin Ratio: 1.2 mmol/L (ref 1.00–2.70)
Albumin: 3.1 g/dL — ABNORMAL LOW (ref 3.5–5.2)
Alk Phosphatase: 67 U/L (ref 35–117)
Anion Gap: 8 mmol/L (ref 2–17)
BUN: 15 mg/dL (ref 8–23)
CALCIUM,CORRECTED,CCA: 9.2 mg/dL (ref 8.8–10.2)
CO2: 27 mmol/L (ref 22–29)
Calcium: 8.5 mg/dL — ABNORMAL LOW (ref 8.8–10.2)
Chloride: 103 mmol/L (ref 98–107)
Creatinine: 0.5 mg/dL (ref 0.5–1.0)
GFR African American: 110 mL/min/{1.73_m2} (ref >=90)
GFR Non-African American: 95 mL/min/{1.73_m2} (ref >=90)
Globulin: 2.6 g/dL (ref 1.9–4.4)
Glucose: 91 mg/dL (ref 70–99)
OSMOLALITY CALCULATED: 276 mOsm/kg (ref 270–287)
Potassium: 3.1 mmol/L — ABNORMAL LOW (ref 3.5–5.3)
Sodium: 138 mmol/L (ref 135–145)
Total Bilirubin: 0.74 mg/dL (ref 0.00–1.20)
Total Protein: 5.7 g/dL — ABNORMAL LOW (ref 6.4–8.3)

## 2020-06-17 LAB — CBC WITH AUTO DIFFERENTIAL
Hematocrit: 22.6 % — ABNORMAL LOW (ref 34.0–47.0)
Hemoglobin: 7.7 g/dL — ABNORMAL LOW (ref 11.5–15.7)
MCH: 34.2 pg (ref 27.0–34.5)
MCHC: 34.1 g/dL (ref 32.0–36.0)
MCV: 100.4 fL — ABNORMAL HIGH (ref 81.0–99.0)
MPV: 8.8 fL (ref 7.2–13.2)
NRBC Absolute: 0 10*3/uL (ref 0.000–0.012)
NRBC Automated: 0 % (ref 0.0–0.2)
Platelets: 23 10*3/uL — CL (ref 140–440)
RBC: 2.25 x10e6/mcL — ABNORMAL LOW (ref 3.60–5.20)
RDW: 17.6 % — ABNORMAL HIGH (ref 11.0–16.0)
WBC: 0.4 10*3/uL — CL (ref 3.8–10.6)

## 2020-06-17 LAB — IMMATURE CELLS
IMMATURE PLT ABSOLUTE: 0.1 10*3/uL
IMMATURE PLT PERCENT: 0.5 % — ABNORMAL LOW (ref 1.2–8.6)

## 2020-06-17 LAB — DIFFERENTIAL, MANUAL
Absolute Lymph #: 0.4 10*3/uL — ABNORMAL LOW (ref 1.0–3.2)
Bands Relative: 1 % (ref 0–5)
Lymphocytes: 90 % — ABNORMAL HIGH (ref 15–45)
Monocytes: 2 % — ABNORMAL LOW (ref 4–12)
Neutrophils %. Manual count: 7 % — ABNORMAL LOW (ref 42–74)
Platelet Estimate: DECREASED — AB
RBC Morphology: ABNORMAL — AB

## 2020-06-17 LAB — PHOSPHORUS: Phosphorus: 2.8 mg/dL (ref 2.5–4.5)

## 2020-06-17 LAB — LACTATE DEHYDROGENASE: LD: 607 U/L — ABNORMAL HIGH (ref 135–214)

## 2020-06-18 LAB — COMPREHENSIVE METABOLIC PANEL
ALT: 28 U/L (ref 0–33)
AST: 20 U/L (ref 0–32)
Albumin/Globulin Ratio: 1.5 mmol/L (ref 1.00–2.70)
Albumin: 3.2 g/dL — ABNORMAL LOW (ref 3.5–5.2)
Alk Phosphatase: 66 U/L (ref 35–117)
Anion Gap: 9 mmol/L (ref 2–17)
BUN: 15 mg/dL (ref 8–23)
CALCIUM,CORRECTED,CCA: 9 mg/dL (ref 8.8–10.2)
CO2: 25 mmol/L (ref 22–29)
Calcium: 8.4 mg/dL — ABNORMAL LOW (ref 8.8–10.2)
Chloride: 106 mmol/L (ref 98–107)
Creatinine: 0.4 mg/dL — ABNORMAL LOW (ref 0.5–1.0)
GFR African American: 119 mL/min/{1.73_m2} (ref >=90)
GFR Non-African American: 103 mL/min/{1.73_m2} (ref >=90)
Globulin: 2.1 g/dL (ref 1.9–4.4)
Glucose: 95 mg/dL (ref 70–99)
OSMOLALITY CALCULATED: 280 mOsm/kg (ref 270–287)
Potassium: 3.9 mmol/L (ref 3.5–5.3)
Sodium: 140 mmol/L (ref 135–145)
Total Bilirubin: 0.53 mg/dL (ref 0.00–1.20)
Total Protein: 5.3 g/dL — ABNORMAL LOW (ref 6.4–8.3)

## 2020-06-18 LAB — CBC WITH AUTO DIFFERENTIAL
Hematocrit: 21.6 % — ABNORMAL LOW (ref 34.0–47.0)
Hemoglobin: 7.1 g/dL — ABNORMAL LOW (ref 11.5–15.7)
MCH: 33.3 pg (ref 27.0–34.5)
MCHC: 32.9 g/dL (ref 32.0–36.0)
MCV: 101.4 fL — ABNORMAL HIGH (ref 81.0–99.0)
MPV: 8.7 fL (ref 7.2–13.2)
NRBC Absolute: 0 10*3/uL (ref 0.000–0.012)
NRBC Automated: 0 % (ref 0.0–0.2)
Platelets: 13 10*3/uL — CL (ref 140–440)
RBC: 2.13 x10e6/mcL — ABNORMAL LOW (ref 3.60–5.20)
RDW: 17.2 % — ABNORMAL HIGH (ref 11.0–16.0)
WBC: 0.3 10*3/uL — CL (ref 3.8–10.6)

## 2020-06-18 LAB — VANCOMYCIN LEVEL, TROUGH: Vancomycin Tr: 16.2 ug/mL (ref 10.0–20.0)

## 2020-06-18 LAB — LACTATE DEHYDROGENASE: LD: 541 U/L — ABNORMAL HIGH (ref 135–214)

## 2020-06-18 LAB — ABO/RH: ABO/Rh: B NEG

## 2020-06-18 LAB — PHOSPHORUS: Phosphorus: 3.1 mg/dL (ref 2.5–4.5)

## 2020-06-18 LAB — IMMATURE CELLS
IMMATURE PLT ABSOLUTE: 0.1 10*3/uL
IMMATURE PLT PERCENT: 0.4 % — ABNORMAL LOW (ref 1.2–8.6)

## 2020-06-18 LAB — DIFFERENTIAL, MANUAL
Absolute Lymph #: 0.3 10*3/uL — ABNORMAL LOW (ref 1.0–3.2)
Eosinophils %: 1 % (ref 0–7)
Lymphocytes: 92 % — ABNORMAL HIGH (ref 15–45)
Monocytes: 3 % — ABNORMAL LOW (ref 4–12)
Neutrophils %. Manual count: 4 % — ABNORMAL LOW (ref 42–74)
Platelet Estimate: DECREASED — AB
RBC Morphology: ABNORMAL — AB

## 2020-06-18 NOTE — Case Communication (Signed)
CM Discharge Planning Assessment - Text       CM Progress Note Entered On:  06/18/2020 11:15 EST    Performed On:  06/18/2020 11:14 EST by Robynn Pane M-RN               CM Progress Note   CM Home/Lay Caregiver Name/Relationship :   Spouse:  Lauren Madden    [phone]   CM Initial Tentative Discharge Plan :   Home with services to be determined   CM Progress Note :   Attempted CM assessment 06/05/2020 and 06/06/2020, will reattempt 06/07/2020.  BPaulson RN CM    06/07/2020:  Met with pt/spouse at bedside.  Pt lives with spouse:  Lauren Madden [phone].  Independent with ADLS, PTA.  DME:  Shower chair.  STE: 3.  Pt and spouse understand 'plan with Dr Bobette Mo and to be here several weeks for treatment.'  Pt and spouse aware that CM will continue to follow for discharge planning needs.  BPaulson RN    06/11/2020:  Reviewed chart:  Referred pt to [facility] PT, per in-pt PT recomendation.  CM will continue to follow.  BPaulson RN CM    06/13/2020:  Met with pt at bedside.  P.T. has ordered 3-in-1 BSC and WW upon discharge.  CM notified Sherilyn Cooter NP for this request and will continue to follow for discharge planning needs.   BPaulson RN CM    06/15/2020:  Reviewed chart.  Pt ambulating in hallway.  CM will follow up with pt on 06/18/2020 for ordering DME.   BPaulson RN CM    06/18/2020:  Reviewed chart.  Spoke with Sherilyn Cooter RE:  d'c plan with potential DME.  Per oncology:   Pt will be inpt x 10 more days/pt is improving with ambulation/strength - DME order will be considered prior to discharge.  Medicare IM explained, signed and placed in chart.  BPaulson EN CM     Robynn Pane M-RN - 06/18/2020 11:14 EST

## 2020-06-18 NOTE — Case Communication (Signed)
CM Discharge Planning Assessment - Text       CM Progress Note Entered On:  06/18/2020 11:14 EST    Performed On:  06/18/2020 11:12 EST by Robynn Pane M-RN               CM Progress Note   CM Home/Lay Caregiver Name/Relationship :   Spouse:  Charde Macfarlane    [phone]   CM Initial Tentative Discharge Plan :   Home with services to be determined   CM Progress Note :   Attempted CM assessment 06/05/2020 and 06/06/2020, will reattempt 06/07/2020.  BPaulson RN CM    06/07/2020:  Met with pt/spouse at bedside.  Pt lives with spouse:  Lauren Madden [phone].  Independent with ADLS, PTA.  DME:  Shower chair.  STE: 3.  Pt and spouse understand 'plan with Dr Bobette Mo and to be here several weeks for treatment.'  Pt and spouse aware that CM will continue to follow for discharge planning needs.  BPaulson RN    06/11/2020:  Reviewed chart:  Referred pt to [facility] PT, per in-pt PT recomendation.  CM will continue to follow.  BPaulson RN CM    06/13/2020:  Met with pt at bedside.  P.T. has ordered 3-in-1 BSC and WW upon discharge.  CM notified Sherilyn Cooter NP for this request and will continue to follow for discharge planning needs.   BPaulson RN CM    06/15/2020:  Reviewed chart.  Pt ambulating in hallway.  CM will follow up with pt on 06/18/2020 for ordering DME.   BPaulson RN CM    06/18/2020:  Reviewed chart.  Spoke with Sherilyn Cooter RE:  d'c plan with potential DME.  Per oncology:   Pt will be inpt x 10 more days/pt is improving with ambulation/strength - DME order will be considered prior to discharge.  BPaulson EN CM     Robynn Pane M-RN - 06/18/2020 11:12 EST

## 2020-06-18 NOTE — Progress Notes (Signed)
 Pharmacy Progress Note                 Vancomycin Progress Note      Vancomycin consult for possible cellulitis.  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  PMH is significant for CVA, PE and seizures.    Day: 6  Current dose: Vancomycin  1250mg  IV q12hrs  Vancomycin random level 11.2mg /l @ 0514 2/24 - doses given at 9/9  Vancomycin trough level 16.2 @ 0920 on 2/28     Labs/Vitals:   Wt: 64kg    BUN/Cr: 15/0.4  WBC: 0.3     Tmax: 37.2    Microbiology:   Blood Cx (2/16): NGTD @ 24 hrs  Urine Cx (2/16): NGTD @ 24 hrs  Other ABX: Cefepime 2 g IV Q8 hrs and Posaconazole 300 mg PO daily    Assessment/Plan:   1. Trough back @ 16.2 - will continue current regimen. Per [facility] this should provide AUC24,ss: 500,   Ctrough,ss: 13.8 mg/L.  AUC goal 93% of time, 3% of trough > 20, and risk of nephrotoxicity 9%.  2.  Will continue to monitor renal function (UOP, GFR, CrCL) as clinically indicated.  3.  Will follow along with all cultures and susceptibilities in order to properly de-escalate vancomycin therapy as clinically appropriate.      Please contact with any questions.  Pharmacy will continue to monitor this patient along with the team.    Thanks for consult,   S. Norman Brain, PharmD.    Signature Line     Electronically Signed on 06/18/2020 12:05 PM EST   ________________________________________________   Brain Gower.JONETTA Jayson Norman

## 2020-06-18 NOTE — Progress Notes (Signed)
Pharmacy Clinical Interventions - Text       Pharmacy Clinical Interventions Entered On:  06/18/2020 12:05 EST    Performed On:  06/18/2020 12:05 EST by Jeanell Sparrow.Altamease Oiler               Interventions   Intervention Type Pharmacy :   Consult-Pharmacokinetic   Clinical Importance Pharmacy :   Potentially minor   Medication Safety Reporting Pharmacy :   Non Medication Event   Pharmacist Intervention Time :   16-30 Minutes   [place], [place].Altamease Oiler - 06/18/2020 12:05 EST

## 2020-06-19 DIAGNOSIS — L03114 Cellulitis of left upper limb: Secondary | ICD-10-CM | POA: Diagnosis not present

## 2020-06-19 DIAGNOSIS — R21 Rash and other nonspecific skin eruption: Secondary | ICD-10-CM | POA: Diagnosis not present

## 2020-06-19 DIAGNOSIS — D61818 Other pancytopenia: Secondary | ICD-10-CM | POA: Diagnosis not present

## 2020-06-19 DIAGNOSIS — C9200 Acute myeloblastic leukemia, not having achieved remission: Secondary | ICD-10-CM | POA: Diagnosis not present

## 2020-06-19 LAB — URINALYSIS WITH REFLEX TO CULTURE
Amorphous, UA: NONE SEEN /HPF
Bilirubin Urine: NEGATIVE
Blood, Urine: NEGATIVE
Glucose, UA: NEGATIVE mg/dL
Ketones, Urine: NEGATIVE mg/dL
MUCUS, URINE: NONE SEEN /LPF
Nitrite, Urine: NEGATIVE
Protein, UA: 30 — AB
RBC, UA: NONE SEEN /HPF (ref 0–2)
Specific Gravity, UA: 1.016 (ref 1.003–1.035)
Urobilinogen, Urine: 0.2 EU/dL
pH, UA: 7 (ref 4.5–8.0)

## 2020-06-19 LAB — COMPREHENSIVE METABOLIC PANEL
ALT: 25 U/L (ref 0–33)
AST: 18 U/L (ref 0–32)
Albumin/Globulin Ratio: 1.5 mmol/L (ref 1.00–2.70)
Albumin: 3.2 g/dL — ABNORMAL LOW (ref 3.5–5.2)
Alk Phosphatase: 74 U/L (ref 35–117)
Anion Gap: 8 mmol/L (ref 2–17)
BUN: 13 mg/dL (ref 8–23)
CALCIUM,CORRECTED,CCA: 8.8 mg/dL (ref 8.8–10.2)
CO2: 27 mmol/L (ref 22–29)
Calcium: 8.2 mg/dL — ABNORMAL LOW (ref 8.8–10.2)
Chloride: 103 mmol/L (ref 98–107)
Creatinine: 0.5 mg/dL (ref 0.5–1.0)
GFR African American: 110 mL/min/{1.73_m2} (ref >=90)
GFR Non-African American: 95 mL/min/{1.73_m2} (ref >=90)
Globulin: 2.1 g/dL (ref 1.9–4.4)
Glucose: 100 mg/dL — ABNORMAL HIGH (ref 70–99)
OSMOLALITY CALCULATED: 276 mOsm/kg (ref 270–287)
Potassium: 3.5 mmol/L (ref 3.5–5.3)
Sodium: 138 mmol/L (ref 135–145)
Total Bilirubin: 0.68 mg/dL (ref 0.00–1.20)
Total Protein: 5.3 g/dL — ABNORMAL LOW (ref 6.4–8.3)

## 2020-06-19 LAB — IMMATURE CELLS
IMMATURE PLT ABSOLUTE: 0.4 10*3/uL
IMMATURE PLT PERCENT: 1.3 % (ref 1.2–8.6)

## 2020-06-19 LAB — CBC WITH AUTO DIFFERENTIAL
Hematocrit: 19 % — CL (ref 34.0–47.0)
Hemoglobin: 6.3 g/dL — CL (ref 11.5–15.7)
MCH: 33.5 pg (ref 27.0–34.5)
MCHC: 33.2 g/dL (ref 32.0–36.0)
MCV: 101.1 fL — ABNORMAL HIGH (ref 81.0–99.0)
MPV: 10.8 fL (ref 7.2–13.2)
NRBC Absolute: 0 10*3/uL (ref 0.000–0.012)
NRBC Automated: 0 % (ref 0.0–0.2)
Platelets: 30 10*3/uL — ABNORMAL LOW (ref 140–440)
RBC: 1.88 x10e6/mcL — ABNORMAL LOW (ref 3.60–5.20)
RDW: 17.4 % — ABNORMAL HIGH (ref 11.0–16.0)
WBC: 0.3 10*3/uL — CL (ref 3.8–10.6)

## 2020-06-19 LAB — LACTATE DEHYDROGENASE: LD: 517 U/L — ABNORMAL HIGH (ref 135–214)

## 2020-06-19 LAB — DIFFERENTIAL, MANUAL
Absolute Lymph #: 0.3 10*3/uL — ABNORMAL LOW (ref 1.0–3.2)
Eosinophils %: 1 % (ref 0–7)
Lymphocytes: 89 % — ABNORMAL HIGH (ref 15–45)
Monocytes: 1 % — ABNORMAL LOW (ref 4–12)
Neutrophils %. Manual count: 6 % — ABNORMAL LOW (ref 42–74)
Platelet Estimate: DECREASED — AB
RBC Morphology: ABNORMAL — AB
Reactive Lymphocytes: 3 %

## 2020-06-19 LAB — PROCALCITONIN: Procalcitonin: 0.23 ng/mL (ref <=0.24)

## 2020-06-19 LAB — PHOSPHORUS: Phosphorus: 3.1 mg/dL (ref 2.5–4.5)

## 2020-06-19 LAB — ANTIBODY SCREEN: Antibody Screen: NEGATIVE

## 2020-06-19 LAB — LACTIC ACID: Lactic Acid: 1.7 mmol/L (ref 0.5–2.0)

## 2020-06-19 NOTE — Progress Notes (Signed)
Inpatient PT Daily Documentation - Text       Inpatient PT Daily Documentation Entered On:  06/19/2020 15:03 EST    Performed On:  06/19/2020 14:59 EST by Marcelino Freestone               Reason for Treatment   Subjective Statement :   #4 Nsg approved and patient agreed.      *Reason for Referral :   PT consult - falls risk, IV, R UE Limb alert, high risk hazardous drug precautions, blood transfusion    06/04/20 - pt with hx of polycythemia vera, presenting with acute thrombocytopenia, acute anemia and a multitude of constitutional symptoms concerning for leukemia. 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   06/05/20 - induction chemotherapy initiated; L UE PICC  06/11/20 - L UE swelling. B UE vascular studies; L UE minimum of occlusive peri-catheter thrombus in the Basilic vein immediately above the PICC insertion - negative for acute DVT.  06/12/20 - L UE PICC removed, R UE PICC placed    PMH: polycythemia vera, CVA, PE, seizure disorder, breast cancer, shingles    PLOF: Pt reporting she lives with her husband in a condo with elevator access. They also have a home in [place], however, plan to remain here after being discharged. Pt is IND at baseline without AD. She denies falls, though, endorses that since the end of January, she's presented with low appetite and decreased energy overall. Pt owns a tub shower with shower stool, but no other DME. She and her husband used to enjoy golfing, however, she has macular degeneration and has a difficult time tracking the ball now.       *Chief Complaint :   dec endurance    Treatment #3     Marcelino Freestone - 06/19/2020 14:59 EST   Pain Assessment   Pain Present :   No actual or suspected pain   Marcelino Freestone - 06/19/2020 14:59 EST   Assessment   PT Impairments or Limitations :   Ambulation deficits, Balance deficits, Bed mobility deficits, Endurance deficits, Equipment training, Safety awareness  deficits, Strength deficits, Transfer deficits, Transition deficits, Visual perceptual deficits   Barriers to Safe Discharge PT :   Limited family support, Safety awareness   Discharge Recommendations :   PT with continued assessment between rehab vs. return home with increased family support, AD as further identified and HHPT pending clinical course and pt's ability to tolerate progression towards goals. If pt does return home, she will likely require an assistive device, 3-in-1 BSC and a tub transfer bench. Will continue to assess.    06/12/20 - pt progressing well towards goals. Anticipate d/c home at this time with increased family support, FWW, tub transfer bench and 3-in-1 BSC. Will continue to assess as pt progresses towards goals.     D/CTransportation Recommendations :   No stretcher   D/C Transportation Recommendations Reviewed :   Yes   PT Treatment Recommendations :   Pt supine in bed upon PTA arrival with blood transfusion going. Pt agreeable to TX and transferred to EOB with min a. Pt sit to stand with SBA to RW and amb with cues to decrease gait velocity for energy conservation. Pt continued to amb with great pace for ~472ft x2 with one standing rest break. Pt returned to room and sat in bedside chair with tech and husband present. Pt left  with needs in reach call bell chair alarm and rec of HHOt at D/C.      Marcelino Freestone 06/19/2020 14:59 EST   Time Spent With Patient   PT Time In :   14:15 EST   PT Time Out :   14:42 EST   PT Gait Training Time :   25 minutes   PTA Gait Training Units :   2 units   PT Total Timed Code Treatment Units :   2 units   PT Total Timed Code Min :   25    PT Total Treatment Time Acute/OP :   25    Marcelino Freestone - 06/19/2020 14:59 EST

## 2020-06-19 NOTE — Progress Notes (Signed)
Pharmacy Clinical Interventions - Text       Pharmacy Clinical Interventions Entered On:  06/19/2020 9:57 EST    Performed On:  06/19/2020 9:56 EST by Jeanell Sparrow.Altamease Oiler               Interventions   Intervention Type Pharmacy :   Consult-Pharmacokinetic   Clinical Importance Pharmacy :   Potentially minor   Medication Safety Reporting Pharmacy :   Non Medication Event   Pharmacist Intervention Time :   16-30 Minutes   [place], [place].Altamease Oiler - 06/19/2020 9:56 EST

## 2020-06-20 LAB — CBC WITH AUTO DIFFERENTIAL
Hematocrit: 23.9 % — ABNORMAL LOW (ref 34.0–47.0)
Hemoglobin: 8.3 g/dL — ABNORMAL LOW (ref 11.5–15.7)
MCH: 32.4 pg (ref 27.0–34.5)
MCHC: 34.7 g/dL (ref 32.0–36.0)
MCV: 93.4 fL (ref 81.0–99.0)
MPV: 10.8 fL (ref 7.2–13.2)
NRBC Absolute: 0 10*3/uL (ref 0.000–0.012)
NRBC Automated: 0 % (ref 0.0–0.2)
Platelets: 16 10*3/uL — CL (ref 140–440)
RBC: 2.56 x10e6/mcL — ABNORMAL LOW (ref 3.60–5.20)
RDW: 19.1 % — ABNORMAL HIGH (ref 11.0–16.0)
WBC: 0.2 10*3/uL — CL (ref 3.8–10.6)

## 2020-06-20 LAB — DIFFERENTIAL, MANUAL
Absolute Lymph #: 0.2 10*3/uL — ABNORMAL LOW (ref 1.0–3.2)
Bands Relative: 1 % (ref 0–5)
Lymphocytes: 87 % — ABNORMAL HIGH (ref 15–45)
Monocytes: 1 % — ABNORMAL LOW (ref 4–12)
Neutrophils %. Manual count: 9 % — ABNORMAL LOW (ref 42–74)
Platelet Estimate: DECREASED — AB
RBC Morphology: ABNORMAL — AB
Reactive Lymphocytes: 2 %

## 2020-06-20 LAB — COMPREHENSIVE METABOLIC PANEL
ALT: 22 U/L (ref 0–33)
AST: 18 U/L (ref 0–32)
Albumin/Globulin Ratio: 1.7 mmol/L (ref 1.00–2.70)
Albumin: 3.3 g/dL — ABNORMAL LOW (ref 3.5–5.2)
Alk Phosphatase: 70 U/L (ref 35–117)
Anion Gap: 12 mmol/L (ref 2–17)
BUN: 15 mg/dL (ref 8–23)
CALCIUM,CORRECTED,CCA: 8.5 mg/dL — ABNORMAL LOW (ref 8.8–10.2)
CO2: 23 mmol/L (ref 22–29)
Calcium: 7.9 mg/dL — ABNORMAL LOW (ref 8.8–10.2)
Chloride: 103 mmol/L (ref 98–107)
Creatinine: 0.5 mg/dL (ref 0.5–1.0)
GFR African American: 110 mL/min/{1.73_m2} (ref >=90)
GFR Non-African American: 95 mL/min/{1.73_m2} (ref >=90)
Globulin: 1.9 g/dL (ref 1.9–4.4)
Glucose: 121 mg/dL — ABNORMAL HIGH (ref 70–99)
OSMOLALITY CALCULATED: 278 mOsm/kg (ref 270–287)
Potassium: 3.4 mmol/L — ABNORMAL LOW (ref 3.5–5.3)
Sodium: 138 mmol/L (ref 135–145)
Total Bilirubin: 0.87 mg/dL (ref 0.00–1.20)
Total Protein: 5.2 g/dL — ABNORMAL LOW (ref 6.4–8.3)

## 2020-06-20 LAB — URINALYSIS WITH REFLEX TO CULTURE
Bilirubin Urine: NEGATIVE
Glucose, UA: NEGATIVE mg/dL
Ketones, Urine: NEGATIVE mg/dL
Leukocyte Esterase, Urine: NEGATIVE
MUCUS, URINE: NONE SEEN /LPF
Nitrite, Urine: NEGATIVE
Protein, UA: 100 — AB
Specific Gravity, UA: 1.017 (ref 1.003–1.035)
Urobilinogen, Urine: 0.2 EU/dL
pH, UA: 5.5 (ref 4.5–8.0)

## 2020-06-20 LAB — LACTIC ACID: Lactic Acid: 1.3 mmol/L (ref 0.5–2.0)

## 2020-06-20 LAB — PHOSPHORUS: Phosphorus: 2.9 mg/dL (ref 2.5–4.5)

## 2020-06-20 LAB — PROCALCITONIN: Procalcitonin: 0.36 ng/mL — ABNORMAL HIGH (ref <=0.24)

## 2020-06-20 LAB — IMMATURE CELLS
IMMATURE PLT ABSOLUTE: 0.3 10*3/uL
IMMATURE PLT PERCENT: 1.9 % (ref 1.2–8.6)

## 2020-06-20 LAB — LACTATE DEHYDROGENASE: LD: 534 U/L — ABNORMAL HIGH (ref 135–214)

## 2020-06-20 NOTE — Progress Notes (Signed)
Chaplaincy Note - Text       Chaplaincy Note Entered On:  06/20/2020 15:19 EST    Performed On:  06/20/2020 15:16 EST by Mellody Dance               Chaplaincy Consult   Faith/Denomination :   Jerrye Bushy - 06/20/2020 15:16 EST   Follow-Up   Chaplaincy Follow-Up Visit Notes :   Chaplain checked in with the pt and her husband. Pt was lying in bed and in good spirits. She had a big smile on her face. Her room is full of family photos and messages from family. Pt's husband explained who everyone was. Pt was very pleased to have this-her daughter had assembled it all. I offered emotional and spiritual support. PC will continue to follow.     Mellody Dance - 06/20/2020 15:16 EST

## 2020-06-21 LAB — CBC WITH AUTO DIFFERENTIAL
Hematocrit: 22.3 % — ABNORMAL LOW (ref 34.0–47.0)
Hemoglobin: 7.5 g/dL — ABNORMAL LOW (ref 11.5–15.7)
MCH: 32.2 pg (ref 27.0–34.5)
MCHC: 33.6 g/dL (ref 32.0–36.0)
MCV: 95.7 fL (ref 81.0–99.0)
MPV: 10.6 fL (ref 7.2–13.2)
NRBC Absolute: 0 10*3/uL (ref 0.000–0.012)
NRBC Automated: 0 % (ref 0.0–0.2)
Platelets: 26 10*3/uL — ABNORMAL LOW (ref 140–440)
RBC: 2.33 x10e6/mcL — ABNORMAL LOW (ref 3.60–5.20)
RDW: 18.4 % — ABNORMAL HIGH (ref 11.0–16.0)
WBC: 0.2 10*3/uL — CL (ref 3.8–10.6)

## 2020-06-21 LAB — COMPREHENSIVE METABOLIC PANEL
ALT: 19 U/L (ref 0–33)
AST: 14 U/L (ref 0–32)
Albumin/Globulin Ratio: 1.4 mmol/L (ref 1.00–2.70)
Albumin: 3 g/dL — ABNORMAL LOW (ref 3.5–5.2)
Alk Phosphatase: 62 U/L (ref 35–117)
Anion Gap: 10 mmol/L (ref 2–17)
BUN: 15 mg/dL (ref 8–23)
CALCIUM,CORRECTED,CCA: 8.7 mg/dL — ABNORMAL LOW (ref 8.8–10.2)
CO2: 24 mmol/L (ref 22–29)
Calcium: 7.9 mg/dL — ABNORMAL LOW (ref 8.8–10.2)
Chloride: 101 mmol/L (ref 98–107)
Creatinine: 0.6 mg/dL (ref 0.5–1.0)
GFR African American: 104 mL/min/{1.73_m2} (ref >=90)
GFR Non-African American: 90 mL/min/{1.73_m2} (ref >=90)
Globulin: 2.2 g/dL (ref 1.9–4.4)
Glucose: 132 mg/dL — ABNORMAL HIGH (ref 70–99)
OSMOLALITY CALCULATED: 273 mOsm/kg (ref 270–287)
Potassium: 3.7 mmol/L (ref 3.5–5.3)
Sodium: 135 mmol/L (ref 135–145)
Total Bilirubin: 0.6 mg/dL (ref 0.00–1.20)
Total Protein: 5.2 g/dL — ABNORMAL LOW (ref 6.4–8.3)

## 2020-06-21 LAB — RESPIRATORY PANEL, MOLECULAR
Adenovirus: NOT DETECTED
Bordetella Pertussis: NOT DETECTED
CORONAVIRUS 229E: NOT DETECTED
CORONAVIRUS HKU1: NOT DETECTED
CORONAVIRUS NL63: NOT DETECTED
CORONAVIRUS OC43: NOT DETECTED
Chlamydia Pneumoniae: NOT DETECTED
Human Metapneumovirus: NOT DETECTED
Human Rhinovirus/Enterovirus: NOT DETECTED
INFLUENZA A: NOT DETECTED
INFLUENZA B: NOT DETECTED
MYCOPLASMA PNEUMONIAE: NOT DETECTED
PARAINFLUENZA 4: NOT DETECTED
Parainfluenza 1: NOT DETECTED
Parainfluenza 2: NOT DETECTED
Parainfluenza 3: NOT DETECTED
Respiratory Syncytial Virus: NOT DETECTED

## 2020-06-21 LAB — URINALYSIS W/ RFLX MICROSCOPIC
Bilirubin Urine: NEGATIVE
Glucose, UA: NEGATIVE mg/dL
Ketones, Urine: NEGATIVE mg/dL
Leukocyte Esterase, Urine: NEGATIVE
Nitrite, Urine: NEGATIVE
Protein, UA: 30 — AB
Specific Gravity, UA: 1.011 (ref 1.003–1.035)
Urobilinogen, Urine: 0.2 EU/dL
pH, UA: 6 (ref 4.5–8.0)

## 2020-06-21 LAB — TOBRAMYCIN,TROUGH: Tobramycin Tr: 0.8 ug/mL (ref 0.5–2.0)

## 2020-06-21 LAB — DIFFERENTIAL, MANUAL
Absolute Lymph #: 0.2 10*3/uL — ABNORMAL LOW (ref 1.0–3.2)
Bands Relative: 1 % (ref 0–5)
Eosinophils %: 1 % (ref 0–7)
Lymphocytes: 88 % — ABNORMAL HIGH (ref 15–45)
Monocytes: 4 % (ref 4–12)
Neutrophils %. Manual count: 3 % — ABNORMAL LOW (ref 42–74)
Nucleated RBCs: 1 100WBC — ABNORMAL HIGH (ref 0–0)
Platelet Estimate: DECREASED — AB
RBC Morphology: ABNORMAL — AB
Reactive Lymphocytes: 3 %

## 2020-06-21 LAB — MICROSCOPIC URINALYSIS: Amorphous, UA: NONE SEEN /HPF

## 2020-06-21 LAB — PHOSPHORUS: Phosphorus: 3.2 mg/dL (ref 2.5–4.5)

## 2020-06-21 LAB — CK: Total CK: 67 U/L (ref 20–180)

## 2020-06-21 LAB — LACTATE DEHYDROGENASE: LD: 501 U/L — ABNORMAL HIGH (ref 135–214)

## 2020-06-21 LAB — IMMATURE CELLS
IMMATURE PLT ABSOLUTE: 1.1 10*3/uL
IMMATURE PLT PERCENT: 4.1 % (ref 1.2–8.6)

## 2020-06-21 NOTE — Nursing Note (Signed)
Vascular Access Nurse Note               ULTRASOUND GUIDED PERIPHERAL IV PLACED    INSERTED BY: Andreas Ohm, BSN, RN, CEN      0.106mL of 1% LIDOCAINE USED TO ANESTHETIZE INSERTION SITE  LEFT MID FOREARM VEIN  20 GAUGE CATHETER  1.75 INCHES LENGTH  X 1 ATTEMPT  POSITIVE BLOOD RETURN. FLUSHED EASILY.  VISUALIZED CATHETER WITHIN VEIN LUMEN VIA ULTRASOUND.  STAT-LOCK AND TRANSPARENT DRESSING APPLIED.  NO COMPLICATIONS  1 - 55mL SALINE FLUSH USED TO PLACE ACCESS.  PATIENT TOLERATED WELL.    Bilateral arms and trunk diffusely covered in red rash.  ID requesting PICC removal in process of ruling out possible rash sources.    Signature [employer]

## 2020-06-21 NOTE — Progress Notes (Signed)
Pharmacy Progress Note               Pharmacy Progress Note      Tobramycin dosing consult for febrile neutropenia.  Lauren Madden admitted 06/04/20 for induction therapy for ALL/Polycythema Vera.      Current dose: tobramycin 280mg   IV q24hrs (5mg /kg)  other Abx: meropenem  500mg  IV q6hrs    Tobramycin level: 0.8 mg/l  @ 1428 (4 hr pre-dose level)    Labs (Last four charted values)  WBC                  C 0.2 (MAR 03) C 0.2 (MAR 02) C 0.3 (MAR 01) C 0.3 (FEB 28)   Cr                   0.6 (MAR 03) 0.5 (MAR 02) 0.5 (MAR 01) L 0.4 (FEB 28)   BUN                  15 (MAR 03) 15 (MAR 02) 13 (MAR 01) 15 (FEB 28)     Vital Signs (last 24 hrs)_____  Last Charted___________  Temp Oral     36.8 degC  (MAR 03 11:22)  Heart Rate Peripheral   H 108bpm  (MAR 02 19:30)  Resp Rate         18 br/min  (MAR 03 07:25)  SBP      124 mmHg  (MAR 03 11:22)  DBP      62 mmHg  (MAR 03 11:22)  SpO2      97 %  (MAR 03 11:22)    Microbiology:  3/2: Blood Culture: NGTD  3/1: Blood Culture: NGTD    Assessment/Plan:  1. Tobramycin level 0.8mg /l - 4 hr level prior to dose.  Target level 5mg /l. Will adjust dose to 180 mg IV q24hrs to bring target to 0.5mg /l.    2. Will closely monitor renal function and redraw level in 2-3 days.    Thank you,  Mar 05, PharmD          Signature Line     Electronically Signed on 06/21/2020 03:45 PM EST   ________________________________________________   Mar 05. D., JENNIFER N

## 2020-06-21 NOTE — Case Communication (Signed)
CM Discharge Planning Assessment - Text       CM Progress Note Entered On:  06/21/2020 14:38 EST    Performed On:  06/21/2020 14:36 EST by Robynn Pane M-RN               CM Progress Note   CM Home/Lay Caregiver Name/Relationship :   Spouse:  Lauren Madden    [phone]   CM Initial Tentative Discharge Plan :   Home with services to be determined   CM Progress Note :   Attempted CM assessment 06/05/2020 and 06/06/2020, will reattempt 06/07/2020.  BPaulson RN CM    06/07/2020:  Met with pt/spouse at bedside.  Pt lives with spouse:  Lauren Madden [phone].  Independent with ADLS, PTA.  DME:  Shower chair.  STE: 3.  Pt and spouse understand 'plan with Dr Bobette Mo and to be here several weeks for treatment.'  Pt and spouse aware that CM will continue to follow for discharge planning needs.  BPaulson RN    06/11/2020:  Reviewed chart:  Referred pt to [facility] PT, per in-pt PT recomendation.  CM will continue to follow.  BPaulson RN CM    06/13/2020:  Met with pt at bedside.  P.T. has ordered 3-in-1 BSC and WW upon discharge.  CM notified Sherilyn Cooter NP for this request and will continue to follow for discharge planning needs.   BPaulson RN CM    06/15/2020:  Reviewed chart.  Pt ambulating in hallway.  CM will follow up with pt on 06/18/2020 for ordering DME.   BPaulson RN CM    06/18/2020:  Reviewed chart.  Spoke with Sherilyn Cooter RE:  d'c plan with potential DME.  Per oncology:   Pt will be inpt x 10 more days/pt is improving with ambulation/strength - DME order will be considered prior to discharge.  Medicare IM explained, signed and placed in chart.  BPaulson RN CM    06/21/2020:  Reivewed chart.   Pt is febrile, not medically ready for discharge.  Entered in [place] with RN with [name] for discharge planning.  CM continues to follow.  PT will be in [place], followed by Oncology until ~ 06/28/2020/currently followed by ID for fevers.  BPaulson RN CM     Robynn Pane M-RN - 06/21/2020 14:36 EST

## 2020-06-22 LAB — PHOSPHORUS: Phosphorus: 2.3 mg/dL — ABNORMAL LOW (ref 2.5–4.5)

## 2020-06-22 LAB — COMPREHENSIVE METABOLIC PANEL
ALT: 24 U/L (ref 0–33)
AST: 19 U/L (ref 0–32)
Albumin/Globulin Ratio: 1.6 mmol/L (ref 1.00–2.70)
Albumin: 3.1 g/dL — ABNORMAL LOW (ref 3.5–5.2)
Alk Phosphatase: 52 U/L (ref 35–117)
Anion Gap: 9 mmol/L (ref 2–17)
BUN: 16 mg/dL (ref 8–23)
CALCIUM,CORRECTED,CCA: 8.7 mg/dL — ABNORMAL LOW (ref 8.8–10.2)
CO2: 26 mmol/L (ref 22–29)
Calcium: 8 mg/dL — ABNORMAL LOW (ref 8.8–10.2)
Chloride: 99 mmol/L (ref 98–107)
Creatinine: 0.6 mg/dL (ref 0.5–1.0)
GFR African American: 104 mL/min/{1.73_m2} (ref >=90)
GFR Non-African American: 90 mL/min/{1.73_m2} (ref >=90)
Globulin: 1.9 g/dL (ref 1.9–4.4)
Glucose: 102 mg/dL — ABNORMAL HIGH (ref 70–99)
OSMOLALITY CALCULATED: 269 mOsm/kg — ABNORMAL LOW (ref 270–287)
Potassium: 3.1 mmol/L — ABNORMAL LOW (ref 3.5–5.3)
Sodium: 134 mmol/L — ABNORMAL LOW (ref 135–145)
Total Bilirubin: 0.55 mg/dL (ref 0.00–1.20)
Total Protein: 5 g/dL — ABNORMAL LOW (ref 6.4–8.3)

## 2020-06-22 LAB — DIFFERENTIAL, MANUAL
Absolute Lymph #: 0.2 10*3/uL — ABNORMAL LOW (ref 1.0–3.2)
Lymphocytes: 76 % — ABNORMAL HIGH (ref 15–45)
Monocytes: 3 % — ABNORMAL LOW (ref 4–12)
Neutrophils %. Manual count: 5 % — ABNORMAL LOW (ref 42–74)
Nucleated RBCs: 1 100WBC — ABNORMAL HIGH (ref 0–0)
Platelet Estimate: DECREASED — AB
RBC Morphology: ABNORMAL — AB
Reactive Lymphocytes: 16 %

## 2020-06-22 LAB — CBC WITH AUTO DIFFERENTIAL
Hematocrit: 17.4 % — CL (ref 34.0–47.0)
Hemoglobin: 6.1 g/dL — CL (ref 11.5–15.7)
MCH: 32.1 pg (ref 27.0–34.5)
MCHC: 35.1 g/dL (ref 32.0–36.0)
MCV: 91.6 fL (ref 81.0–99.0)
MPV: 12.9 fL (ref 7.2–13.2)
NRBC Absolute: 0 10*3/uL (ref 0.000–0.012)
NRBC Automated: 0 % (ref 0.0–0.2)
Platelets: 15 10*3/uL — CL (ref 140–440)
RBC: 1.9 x10e6/mcL — ABNORMAL LOW (ref 3.60–5.20)
RDW: 17.8 % — ABNORMAL HIGH (ref 11.0–16.0)
WBC: 0.2 10*3/uL — CL (ref 3.8–10.6)

## 2020-06-22 LAB — PROCALCITONIN: Procalcitonin: 0.43 ng/mL — ABNORMAL HIGH (ref <=0.24)

## 2020-06-22 LAB — ABO/RH: ABO/Rh: B NEG

## 2020-06-22 LAB — ASP GALACTOMANNAN AG: Aspergillus Galacto AG: 0.03 Index (ref 0.00–0.49)

## 2020-06-22 LAB — POTASSIUM: Potassium: 4.3 mmol/L (ref 3.5–5.3)

## 2020-06-22 LAB — ANTIBODY SCREEN: Antibody Screen: NEGATIVE

## 2020-06-22 LAB — LACTATE DEHYDROGENASE: LD: 461 U/L — ABNORMAL HIGH (ref 135–214)

## 2020-06-22 LAB — FUNGITELL: Fungitell (Beta D-Glucan): <31 pg/mL (ref <80)

## 2020-06-22 NOTE — Progress Notes (Signed)
 PT Time Spent with Patient Acute/OP-Text       PT Time Spent With Patient Outpatient/Acute Entered On:  06/22/2020 7:43 EST    Performed On:  06/22/2020 7:32 EST by Gean Mallard               Time Spent With Patient   PT Time In :   7:32 EST   PT Time Out :   7:36 EST   Gean Mallard - 06/22/2020 7:40 EST   PT Treatment Time Comment :   RN cleared pt for therapy. Upon entering PCT and family member assisting pt back to bed from [facility]. pt fatigue at this time. per family member she and I walked three loops with the walker and she did great. It just depends on the day. I can walk with her again if you cannot come back, but she is exhausted right now and just got into bed. PT will follow up if time allows. Educated family on continue mobilization for strengthening. PT will see pt x1 visit then D/C to floor staff as she is mobilizing with staffing/family safely.     Gean Mallard - 06/22/2020 8:03 EST     PT Functional Training Time :   0 minutes   PTA Functional Training Units :   0 units   PT Total Timed Code Treatment Units :   0 units   PT Total Timed Code Min :   0    PT Total Treatment Time Acute/OP :   0    Gean Mallard - 06/22/2020 7:40 EST   PT Units Cancelled Missed     PT Units Lost #1          Amount :    2               Reason :    Other: please see above                Gean Mallard - 06/22/2020 7:40 EST

## 2020-06-22 NOTE — Case Communication (Signed)
CM Discharge Planning Assessment - Text       CM Progress Note Entered On:  06/22/2020 13:08 EST    Performed On:  06/22/2020 13:07 EST by Robynn Pane M-RN               CM Progress Note   CM Home/Lay Caregiver Name/Relationship :   Spouse:  Tiarah Shisler    [phone]   CM Initial Tentative Discharge Plan :   Home with services to be determined   CM Progress Note :   Attempted CM assessment 06/05/2020 and 06/06/2020, will reattempt 06/07/2020.  BPaulson RN CM    06/07/2020:  Met with pt/spouse at bedside.  Pt lives with spouse:  Nusrat Encarnacion [phone].  Independent with ADLS, PTA.  DME:  Shower chair.  STE: 3.  Pt and spouse understand 'plan with Dr Bobette Mo and to be here several weeks for treatment.'  Pt and spouse aware that CM will continue to follow for discharge planning needs.  BPaulson RN    06/11/2020:  Reviewed chart:  Referred pt to [facility] PT, per in-pt PT recomendation.  CM will continue to follow.  BPaulson RN CM    06/13/2020:  Met with pt at bedside.  P.T. has ordered 3-in-1 BSC and WW upon discharge.  CM notified Sherilyn Cooter NP for this request and will continue to follow for discharge planning needs.   BPaulson RN CM    06/15/2020:  Reviewed chart.  Pt ambulating in hallway.  CM will follow up with pt on 06/18/2020 for ordering DME.   BPaulson RN CM    06/18/2020:  Reviewed chart.  Spoke with Sherilyn Cooter RE:  d'c plan with potential DME.  Per oncology:   Pt will be inpt x 10 more days/pt is improving with ambulation/strength - DME order will be considered prior to discharge.  Medicare IM explained, signed and placed in chart.  BPaulson RN CM    06/21/2020:  Reivewed chart.   Pt is febrile, not medically ready for discharge.  Entered in [place] with RN with [name] for discharge planning.  CM continues to follow.  PT will be in [place], followed by Oncology until ~ 06/28/2020/currently followed by ID for fevers.  BPaulson RN CM    06/22/2020:  Reviewed chart.  Pt in [place] thru w/e.  Attempted to  update Medicare IM, pt alseep and unable to reach spouse via tele.  BPaulson RN CM     Robynn Pane M-RN - 06/22/2020 13:07 EST

## 2020-06-23 LAB — DIFFERENTIAL, MANUAL
Absolute Lymph #: 0.3 10*3/uL — ABNORMAL LOW (ref 1.0–3.2)
Lymphocytes: 67 % — ABNORMAL HIGH (ref 15–45)
Lymphs, Absolute Count, Reactive: 0.1 10*3/uL
Monocytes: 10 % (ref 4–12)
Neutrophils %. Manual count: 6 % — ABNORMAL LOW (ref 42–74)
Platelet Estimate: DECREASED — AB
RBC Morphology: ABNORMAL — AB
Reactive Lymphocytes: 17 %

## 2020-06-23 LAB — COMPREHENSIVE METABOLIC PANEL
ALT: 42 U/L — ABNORMAL HIGH (ref 0–33)
AST: 28 U/L (ref 0–32)
Albumin/Globulin Ratio: 1.4 mmol/L (ref 1.00–2.70)
Albumin: 3.1 g/dL — ABNORMAL LOW (ref 3.5–5.2)
Alk Phosphatase: 61 U/L (ref 35–117)
Anion Gap: 8 mmol/L (ref 2–17)
BUN: 18 mg/dL (ref 8–23)
CALCIUM,CORRECTED,CCA: 8.5 mg/dL — ABNORMAL LOW (ref 8.8–10.2)
CO2: 27 mmol/L (ref 22–29)
Calcium: 7.8 mg/dL — ABNORMAL LOW (ref 8.8–10.2)
Chloride: 101 mmol/L (ref 98–107)
Creatinine: 0.5 mg/dL (ref 0.5–1.0)
GFR African American: 110 mL/min/{1.73_m2} (ref >=90)
GFR Non-African American: 95 mL/min/{1.73_m2} (ref >=90)
Globulin: 2.2 g/dL (ref 1.9–4.4)
Glucose: 91 mg/dL (ref 70–99)
OSMOLALITY CALCULATED: 273 mOsm/kg (ref 270–287)
Potassium: 3.5 mmol/L (ref 3.5–5.3)
Sodium: 137 mmol/L (ref 135–145)
Total Bilirubin: 0.63 mg/dL (ref 0.00–1.20)
Total Protein: 5.3 g/dL — ABNORMAL LOW (ref 6.4–8.3)

## 2020-06-23 LAB — CBC WITH AUTO DIFFERENTIAL
Hematocrit: 21.2 % — ABNORMAL LOW (ref 34.0–47.0)
Hemoglobin: 7.3 g/dL — ABNORMAL LOW (ref 11.5–15.7)
MCH: 32.3 pg (ref 27.0–34.5)
MCHC: 34.4 g/dL (ref 32.0–36.0)
MCV: 93.8 fL (ref 81.0–99.0)
MPV: 11.2 fL (ref 7.2–13.2)
NRBC Absolute: 0 10*3/uL (ref 0.000–0.012)
NRBC Automated: 0 % (ref 0.0–0.2)
Platelets: 38 10*3/uL — ABNORMAL LOW (ref 140–440)
RBC: 2.26 x10e6/mcL — ABNORMAL LOW (ref 3.60–5.20)
RDW: 18.3 % — ABNORMAL HIGH (ref 11.0–16.0)
WBC: 0.4 10*3/uL — CL (ref 3.8–10.6)

## 2020-06-23 LAB — PHOSPHORUS: Phosphorus: 2 mg/dL — ABNORMAL LOW (ref 2.5–4.5)

## 2020-06-23 LAB — IMMATURE CELLS
IMMATURE PLT ABSOLUTE: 3.7 10*3/uL
IMMATURE PLT PERCENT: 9.8 % — ABNORMAL HIGH (ref 1.2–8.6)

## 2020-06-23 LAB — LACTATE DEHYDROGENASE: LD: 499 U/L — ABNORMAL HIGH (ref 135–214)

## 2020-06-23 NOTE — Progress Notes (Signed)
Pharmacy Clinical Interventions - Text       Pharmacy Clinical Interventions Entered On:  06/23/2020 10:37 EST    Performed On:  06/23/2020 10:37 EST by Rogelia Boga,  Matthew-PharmD               Interventions   Intervention Type Pharmacy :   Consult-Pharmacokinetic follow-up   Clinical Importance Pharmacy :   Potentially minor   Medication Safety Reporting Pharmacy :   Non Medication Event   Pharmacist Intervention Time :   6-15 Minutes   Morrisette,  Matthew-PharmD - 06/23/2020 10:37 EST

## 2020-06-24 LAB — COMPREHENSIVE METABOLIC PANEL
ALT: 45 U/L — ABNORMAL HIGH (ref 0–33)
AST: 25 U/L (ref 0–32)
Albumin/Globulin Ratio: 1.4 mmol/L (ref 1.00–2.70)
Albumin: 3 g/dL — ABNORMAL LOW (ref 3.5–5.2)
Alk Phosphatase: 57 U/L (ref 35–117)
Anion Gap: 10 mmol/L (ref 2–17)
BUN: 14 mg/dL (ref 8–23)
CALCIUM,CORRECTED,CCA: 8.7 mg/dL — ABNORMAL LOW (ref 8.8–10.2)
CO2: 28 mmol/L (ref 22–29)
Calcium: 7.9 mg/dL — ABNORMAL LOW (ref 8.8–10.2)
Chloride: 98 mmol/L (ref 98–107)
Creatinine: 0.4 mg/dL — ABNORMAL LOW (ref 0.5–1.0)
GFR African American: 119 mL/min/{1.73_m2} (ref >=90)
GFR Non-African American: 103 mL/min/{1.73_m2} (ref >=90)
Globulin: 2.2 g/dL (ref 1.9–4.4)
Glucose: 85 mg/dL (ref 70–99)
OSMOLALITY CALCULATED: 272 mOsm/kg (ref 270–287)
Potassium: 3.1 mmol/L — ABNORMAL LOW (ref 3.5–5.3)
Sodium: 136 mmol/L (ref 135–145)
Total Bilirubin: 0.57 mg/dL (ref 0.00–1.20)
Total Protein: 5.2 g/dL — ABNORMAL LOW (ref 6.4–8.3)

## 2020-06-24 LAB — CBC WITH AUTO DIFFERENTIAL
Hematocrit: 21.3 % — ABNORMAL LOW (ref 34.0–47.0)
Hemoglobin: 7.1 g/dL — ABNORMAL LOW (ref 11.5–15.7)
MCH: 31.6 pg (ref 27.0–34.5)
MCHC: 33.3 g/dL (ref 32.0–36.0)
MCV: 94.7 fL (ref 81.0–99.0)
MPV: 11.9 fL (ref 7.2–13.2)
NRBC Absolute: 0 10*3/uL (ref 0.000–0.012)
NRBC Automated: 0 % (ref 0.0–0.2)
Platelets: 61 10*3/uL — ABNORMAL LOW (ref 140–440)
RBC: 2.25 x10e6/mcL — ABNORMAL LOW (ref 3.60–5.20)
RDW: 18 % — ABNORMAL HIGH (ref 11.0–16.0)
WBC: 0.7 10*3/uL — CL (ref 3.8–10.6)

## 2020-06-24 LAB — LACTATE DEHYDROGENASE: LD: 500 U/L — ABNORMAL HIGH (ref 135–214)

## 2020-06-24 LAB — DIFFERENTIAL, MANUAL
Absolute Lymph #: 0.5 10*3/uL — ABNORMAL LOW (ref 1.0–3.2)
Eosinophils %: 2 % (ref 0–7)
Lymphocytes: 77 % — ABNORMAL HIGH (ref 15–45)
Lymphs, Absolute Count, Reactive: 0.1 10*3/uL
Monocytes: 7 % (ref 4–12)
Neutrophils %. Manual count: 2 % — ABNORMAL LOW (ref 42–74)
Platelet Estimate: DECREASED — AB
RBC Morphology: ABNORMAL — AB
Reactive Lymphocytes: 12 %

## 2020-06-24 LAB — PHOSPHORUS: Phosphorus: 2.1 mg/dL — ABNORMAL LOW (ref 2.5–4.5)

## 2020-06-24 LAB — POTASSIUM: Potassium: 4.4 mmol/L (ref 3.5–5.3)

## 2020-06-24 LAB — CULTURE, BLOOD 1

## 2020-06-24 LAB — IMMATURE CELLS
IMMATURE PLT ABSOLUTE: 7.1 10*3/uL
IMMATURE PLT PERCENT: 11.6 % — ABNORMAL HIGH (ref 1.2–8.6)

## 2020-06-24 LAB — CMV DNA, QUANTITATIVE, PCR: CMV DNA, PCR IU/mL: POSITIVE IU/mL

## 2020-06-25 LAB — COMPREHENSIVE METABOLIC PANEL
ALT: 41 U/L — ABNORMAL HIGH (ref 0–33)
AST: 20 U/L (ref 0–32)
Albumin/Globulin Ratio: 1.3 mmol/L (ref 1.00–2.70)
Albumin: 3 g/dL — ABNORMAL LOW (ref 3.5–5.2)
Alk Phosphatase: 62 U/L (ref 35–117)
Anion Gap: 9 mmol/L (ref 2–17)
BUN: 13 mg/dL (ref 8–23)
CALCIUM,CORRECTED,CCA: 8.9 mg/dL (ref 8.8–10.2)
CO2: 29 mmol/L (ref 22–29)
Calcium: 8.1 mg/dL — ABNORMAL LOW (ref 8.8–10.2)
Chloride: 100 mmol/L (ref 98–107)
Creatinine: 0.4 mg/dL — ABNORMAL LOW (ref 0.5–1.0)
GFR African American: 119 mL/min/{1.73_m2} (ref >=90)
GFR Non-African American: 103 mL/min/{1.73_m2} (ref >=90)
Globulin: 2.3 g/dL (ref 1.9–4.4)
Glucose: 88 mg/dL (ref 70–99)
OSMOLALITY CALCULATED: 275 mOsm/kg (ref 270–287)
Potassium: 3.7 mmol/L (ref 3.5–5.3)
Sodium: 138 mmol/L (ref 135–145)
Total Bilirubin: 0.55 mg/dL (ref 0.00–1.20)
Total Protein: 5.3 g/dL — ABNORMAL LOW (ref 6.4–8.3)

## 2020-06-25 LAB — DIFFERENTIAL, MANUAL
Absolute Lymph #: 0.6 10*3/uL — ABNORMAL LOW (ref 1.0–3.2)
Absolute Mono #: 0.2 10*3/uL — ABNORMAL LOW (ref 0.3–1.0)
Bands Relative: 1 % (ref 0–5)
Eosinophils %: 1 % (ref 0–7)
Lymphocytes: 59 % — ABNORMAL HIGH (ref 15–45)
Lymphs, Absolute Count, Reactive: 0.2 10*3/uL
Monocytes: 18 % — ABNORMAL HIGH (ref 4–12)
Neutrophils %. Manual count: 3 % — ABNORMAL LOW (ref 42–74)
Nucleated RBCs: 1 100WBC — ABNORMAL HIGH (ref 0–0)
Platelet Estimate: DECREASED — AB
RBC Morphology: ABNORMAL — AB
Reactive Lymphocytes: 18 %

## 2020-06-25 LAB — CBC WITH AUTO DIFFERENTIAL
Hematocrit: 21.3 % — ABNORMAL LOW (ref 34.0–47.0)
Hemoglobin: 7 g/dL — CL (ref 11.5–15.7)
MCH: 31.3 pg (ref 27.0–34.5)
MCHC: 32.9 g/dL (ref 32.0–36.0)
MCV: 95.1 fL (ref 81.0–99.0)
MPV: 12.3 fL (ref 7.2–13.2)
NRBC Absolute: 0 10*3/uL (ref 0.000–0.012)
NRBC Automated: 0 % (ref 0.0–0.2)
Platelets: 106 10*3/uL — ABNORMAL LOW (ref 140–440)
RBC: 2.24 x10e6/mcL — ABNORMAL LOW (ref 3.60–5.20)
RDW: 17.6 % — ABNORMAL HIGH (ref 11.0–16.0)
WBC: 1.1 10*3/uL — CL (ref 3.8–10.6)

## 2020-06-25 LAB — LACTATE DEHYDROGENASE: LD: 510 U/L — ABNORMAL HIGH (ref 135–214)

## 2020-06-25 LAB — IMMATURE CELLS
IMMATURE PLT ABSOLUTE: 14.1 10*3/uL
IMMATURE PLT PERCENT: 13.3 % — ABNORMAL HIGH (ref 1.2–8.6)

## 2020-06-25 LAB — POTASSIUM: Potassium: 4.2 mmol/L (ref 3.5–5.3)

## 2020-06-25 LAB — PHOSPHORUS: Phosphorus: 2.5 mg/dL (ref 2.5–4.5)

## 2020-06-25 LAB — CULTURE, BLOOD 1

## 2020-06-25 NOTE — Progress Notes (Signed)
Pharmacy Clinical Interventions - Text       Pharmacy Clinical Interventions Entered On:  06/25/2020 13:05 EST    Performed On:  06/25/2020 13:03 EST by HUNT, RPh, MARILOU H               Interventions   Intervention Type Pharmacy :   Dosage Adjustment   Associated Order(s) Pharmacy :   Betamethasone dc'd  Triamcinolone 0.1% started available in 454 gm tub    pt has severe rash, monitor duration of use    if > 1week may want to switch to hydrocortisone   Clinical Importance Pharmacy :   Routine safety/clinical monitoring with no change needed   Pharmacy Order Initiated By :   Pharmacist   Medication Safety Reporting Pharmacy :   Non Medication Event   Pharmacy Prescribing Physician :   Liz Malady   Pharmacy Additional Information :   last dose of solu-medrol 40 mg IV x 1 3/8 0900   Pharmacist Intervention Time :   > 30 Minutes   HUNT, RPh, MARILOU H - 06/25/2020 13:03 EST

## 2020-06-25 NOTE — Progress Notes (Signed)
Pharmacy Clinical Interventions - Text       Pharmacy Clinical Interventions Entered On:  06/25/2020 13:02 EST    Performed On:  06/25/2020 13:02 EST by HUNT, RPh, MARILOU H               Interventions   Intervention Type Pharmacy :   Consult-Other   Clinical Importance Pharmacy :   Routine safety/clinical monitoring with no change needed   Pharmacy Order Initiated By :   Pharmacist   Medication Safety Reporting Pharmacy :   Non Medication Event   Pharmacist Intervention Time :   > [address]   Kaleen Mask, MARILOU H - 06/25/2020 13:02 EST

## 2020-06-25 NOTE — Progress Notes (Signed)
Pharmacy Clinical Interventions - Text       Pharmacy Clinical Interventions Entered On:  06/25/2020 10:05 EST    Performed On:  06/25/2020 10:02 EST by HUNT, RPh, MARILOU H               Interventions   Intervention Type Pharmacy :   Optimize monitoring   Associated Order(s) Pharmacy :   MD wants 2 tubes of betamethasone valerate sent for each application   Clinical Importance Pharmacy :   Routine safety/clinical monitoring with no change needed   Pharmacy Order Initiated By :   Pharmacist   Medication Safety Reporting Pharmacy :   Non Medication Event   Pharmacist Intervention Time :   [address]   Emmit Pomfret - 06/25/2020 10:02 EST

## 2020-06-26 LAB — LACTATE DEHYDROGENASE: LD: 472 U/L — ABNORMAL HIGH (ref 135–214)

## 2020-06-26 LAB — COMPREHENSIVE METABOLIC PANEL
ALT: 33 U/L (ref 0–33)
AST: 14 U/L (ref 0–32)
Albumin/Globulin Ratio: 1.2 mmol/L (ref 1.00–2.70)
Albumin: 3.1 g/dL — ABNORMAL LOW (ref 3.5–5.2)
Alk Phosphatase: 68 U/L (ref 35–117)
Anion Gap: 9 mmol/L (ref 2–17)
BUN: 14 mg/dL (ref 8–23)
CALCIUM,CORRECTED,CCA: 8.8 mg/dL (ref 8.8–10.2)
CO2: 30 mmol/L — ABNORMAL HIGH (ref 22–29)
Calcium: 8.1 mg/dL — ABNORMAL LOW (ref 8.8–10.2)
Chloride: 101 mmol/L (ref 98–107)
Creatinine: 0.4 mg/dL — ABNORMAL LOW (ref 0.5–1.0)
GFR African American: 119 mL/min/{1.73_m2} (ref >=90)
GFR Non-African American: 103 mL/min/{1.73_m2} (ref >=90)
Globulin: 2.5 g/dL (ref 1.9–4.4)
Glucose: 88 mg/dL (ref 70–99)
OSMOLALITY CALCULATED: 279 mOsm/kg (ref 270–287)
Potassium: 3.4 mmol/L — ABNORMAL LOW (ref 3.5–5.3)
Sodium: 140 mmol/L (ref 135–145)
Total Bilirubin: 0.5 mg/dL (ref 0.00–1.20)
Total Protein: 5.6 g/dL — ABNORMAL LOW (ref 6.4–8.3)

## 2020-06-26 LAB — DIFFERENTIAL, MANUAL
Absolute Lymph #: 0.5 10*3/uL — ABNORMAL LOW (ref 1.0–3.2)
Absolute Mono #: 0.5 10*3/uL (ref 0.3–1.0)
Bands Relative: 3 % (ref 0–5)
Blasts Absolute: 0.1 10*3/uL
Blasts Relative: 4 % — ABNORMAL HIGH (ref 0–0)
Eosinophils %: 1 % (ref 0–7)
Lymphocytes: 42 % (ref 15–45)
Lymphs, Absolute Count, Reactive: 0.1 10*3/uL
Metamyelocytes Relative: 1 %
Monocytes: 35 % — ABNORMAL HIGH (ref 4–12)
Myelocyte Percent: 1 %
Neutrophils %. Manual count: 5 % — ABNORMAL LOW (ref 42–74)
Neutrophils Absolute: 0.1 10*3/uL — ABNORMAL LOW (ref 1.6–7.3)
Nucleated RBCs: 1 100WBC — ABNORMAL HIGH (ref 0–0)
Platelet Estimate: ADEQUATE
RBC Morphology: ABNORMAL — AB
Reactive Lymphocytes: 8 %

## 2020-06-26 LAB — CBC WITH AUTO DIFFERENTIAL
Hematocrit: 21.6 % — ABNORMAL LOW (ref 34.0–47.0)
Hemoglobin: 7.2 g/dL — ABNORMAL LOW (ref 11.5–15.7)
MCH: 31.9 pg (ref 27.0–34.5)
MCHC: 33.3 g/dL (ref 32.0–36.0)
MCV: 95.6 fL (ref 81.0–99.0)
MPV: 11.8 fL (ref 7.2–13.2)
NRBC Absolute: 0.02 10*3/uL — ABNORMAL HIGH (ref 0.000–0.012)
NRBC Automated: 1.5 % — ABNORMAL HIGH (ref 0.0–0.2)
Platelets: 176 10*3/uL (ref 140–440)
RBC: 2.26 x10e6/mcL — ABNORMAL LOW (ref 3.60–5.20)
RDW: 17.4 % — ABNORMAL HIGH (ref 11.0–16.0)
WBC: 1.3 10*3/uL — CL (ref 3.8–10.6)

## 2020-06-26 LAB — PROCALCITONIN: Procalcitonin: 0.1 ng/mL (ref <=0.24)

## 2020-06-26 LAB — PHOSPHORUS: Phosphorus: 2.6 mg/dL (ref 2.5–4.5)

## 2020-06-26 NOTE — Progress Notes (Signed)
 Inpatient PT Daily Documentation - Text       Inpatient PT Daily Documentation Entered On:  06/26/2020 15:21 EST    Performed On:  06/26/2020 15:16 EST by MILLEN, PTA, SANDRA L               Reason for Treatment   Subjective Statement :   #5 Pt and RN ok to work with P.T. I feel like that RW is very cumbersome do you have any tips for me?     *Reason for Referral :   PT consult - falls risk, IV, R UE Limb alert, high risk hazardous drug precautions, blood transfusion    06/04/20 - pt with hx of polycythemia vera, presenting with acute thrombocytopenia, acute anemia and a multitude of constitutional symptoms concerning for leukemia. 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   06/05/20 - induction chemotherapy initiated; L UE PICC  06/11/20 - L UE swelling. B UE vascular studies; L UE minimum of occlusive peri-catheter thrombus in the Basilic vein immediately above the PICC insertion - negative for acute DVT.  06/12/20 - L UE PICC removed, R UE PICC placed    PMH: polycythemia vera, CVA, PE, seizure disorder, breast cancer, shingles    PLOF: Pt reporting she lives with her husband in a condo with elevator access. They also have a home in [place] , however, plan to remain here after being discharged. Pt is IND at baseline without AD. She denies falls, though, endorses that since the end of January, she's presented with low appetite and decreased energy overall. Pt owns a tub shower with shower stool, but no other DME. She and her husband used to enjoy golfing, however, she has macular degeneration and has a difficult time tracking the ball now.       *Chief Complaint :   dec endurance    Treatment #3     MILLEN, PTA, SANDRA L - 06/26/2020 15:16 EST   Pain Assessment   Pain Present :   No actual or suspected pain   MILLEN, PTA, SANDRA L - 06/26/2020 15:16 EST   Assessment   PT Impairments or Limitations :   Ambulation deficits, Balance deficits,  Bed mobility deficits, Endurance deficits, Equipment training, Safety awareness deficits, Strength deficits, Transfer deficits, Transition deficits, Visual perceptual deficits   Barriers to Safe Discharge PT :   Limited family support, Safety awareness   Discharge Recommendations :   PT with continued assessment between rehab vs. return home with increased family support, AD as further identified and HHPT pending clinical course and pt's ability to tolerate progression towards goals. If pt does return home, she will likely require an assistive rollator device, 3-in-1 BSC and a tub transfer bench. Will continue to assess.    06/12/20 - pt progressing well towards goals. Anticipate d/c home at this time with increased family support, rollator, tub transfer bench and 3-in-1 BSC. Will continue to assess as pt progresses towards goals.     D/CTransportation Recommendations :   No stretcher   D/C Transportation Recommendations Reviewed :   Yes   PT Treatment Recommendations :   Pt presents sitting in recliner c/o RW very cumbersome. GAit training with rollator. Pt sit>stand Rollator CS amb rollator 767ft including ascend and descend ramp in atrium with unlevel surfaces (concrete pavers) pt returned to room and seated in recliner CS. Pt with all needs in reach. Rollator in room for pt use while  in hospital beliongs to outpatient rehab dept 6 petite.      MILLEN, PTA, SANDRA L - 06/26/2020 15:16 EST   Time Spent With Patient   PT Time In :   14:37 EST   PT Time Out :   15:00 EST   PT Gait Training Time :   23 minutes   PTA Gait Training Units :   2 units   PT Total Timed Code Treatment Units :   2 units   PT Total Timed Code Min :   23    PT Total Treatment Time Acute/OP :   23    MILLEN, PTA, SANDRA L - 06/26/2020 15:16 EST

## 2020-06-26 NOTE — Case Communication (Signed)
CM Discharge Planning Assessment - Text       CM Progress Note Entered On:  06/26/2020 14:25 EST    Performed On:  06/26/2020 14:14 EST by Robynn Pane M-RN               CM Progress Note   CM Home/Lay Caregiver Name/Relationship :   Spouse:  Rakeb Kibble    [phone]   CM Initial Tentative Discharge Plan :   Home with services to be determined   Robynn Pane M-RN - 06/26/2020 14:14 EST   CM Progress Note :   Attempted CM assessment 06/05/2020 and 06/06/2020, will reattempt 06/07/2020.  BPaulson RN CM    06/07/2020:  Met with pt/spouse at bedside.  Pt lives with spouse:  Lianni Kanaan [phone].  Independent with ADLS, PTA.  DME:  Shower chair.  STE: 3.  Pt and spouse understand 'plan with Dr Bobette Mo and to be here several weeks for treatment.'  Pt and spouse aware that CM will continue to follow for discharge planning needs.  BPaulson RN    06/11/2020:  Reviewed chart:  Referred pt to [facility] PT, per in-pt PT recomendation.  CM will continue to follow.  BPaulson RN CM    06/13/2020:  Met with pt at bedside.  P.T. has ordered 3-in-1 BSC and WW upon discharge.  CM notified Sherilyn Cooter NP for this request and will continue to follow for discharge planning needs.   BPaulson RN CM    06/15/2020:  Reviewed chart.  Pt ambulating in hallway.  CM will follow up with pt on 06/18/2020 for ordering DME.   BPaulson RN CM    06/18/2020:  Reviewed chart.  Spoke with Sherilyn Cooter RE:  d'c plan with potential DME.  Per oncology:   Pt will be inpt x 10 more days/pt is improving with ambulation/strength - DME order will be considered prior to discharge.  Medicare IM explained, signed and placed in chart.  BPaulson RN CM    06/21/2020:  Reivewed chart.   Pt is febrile, not medically ready for discharge.  Entered in [place] with RN with [name] for discharge planning.  CM continues to follow.  PT will be in [place], followed by Oncology until ~ 06/28/2020/currently followed by ID for fevers.  BPaulson RN CM    06/22/2020:  Reviewed  chart.  Pt in [place] thru w/e.  Attempted to update Medicare IM, pt alseep and unable to reach spouse via tele.  BPaulson RN CM    06/26/2020:  Reviewed chart.  PT continues treatment with Oncology/ID following.  Medicare IM signed, placed in chart.  There are no dicharge needs identified at this time.  Pt is ambulating and 'does not feel that she needs any HH/DME upon discharge.'  BPaulson RN CM     Robynn Pane M-RN - 06/26/2020 14:25 EST

## 2020-06-27 DIAGNOSIS — L03114 Cellulitis of left upper limb: Secondary | ICD-10-CM | POA: Diagnosis not present

## 2020-06-27 DIAGNOSIS — C9200 Acute myeloblastic leukemia, not having achieved remission: Secondary | ICD-10-CM | POA: Diagnosis not present

## 2020-06-27 DIAGNOSIS — R21 Rash and other nonspecific skin eruption: Secondary | ICD-10-CM | POA: Diagnosis not present

## 2020-06-27 DIAGNOSIS — D61818 Other pancytopenia: Secondary | ICD-10-CM | POA: Diagnosis not present

## 2020-06-27 LAB — DIFFERENTIAL, MANUAL
Absolute Lymph #: 0.5 10*3/uL — ABNORMAL LOW (ref 1.0–3.2)
Absolute Mono #: 0.6 10*3/uL (ref 0.3–1.0)
Bands Relative: 1 % (ref 0–5)
Basophils %: 1 % (ref 0–2)
Blasts Absolute: 0.1 10*3/uL
Blasts Relative: 6 % — ABNORMAL HIGH (ref 0–0)
Lymphocytes: 31 % (ref 15–45)
Lymphs, Absolute Count, Reactive: 0.1 10*3/uL
Metamyelocytes Relative: 1 %
Monocytes: 40 % — ABNORMAL HIGH (ref 4–12)
Myelocyte Percent: 3 %
Neutrophils %. Manual count: 12 % — ABNORMAL LOW (ref 42–74)
Neutrophils Absolute: 0.2 10*3/uL — ABNORMAL LOW (ref 1.6–7.3)
Nucleated RBCs: 2 100WBC — ABNORMAL HIGH (ref 0–0)
Platelet Estimate: ADEQUATE
RBC Morphology: ABNORMAL — AB
Reactive Lymphocytes: 5 %

## 2020-06-27 LAB — COMPREHENSIVE METABOLIC PANEL
ALT: 26 U/L (ref 0–33)
AST: 13 U/L (ref 0–32)
Albumin/Globulin Ratio: 1.2 mmol/L (ref 1.00–2.70)
Albumin: 3 g/dL — ABNORMAL LOW (ref 3.5–5.2)
Alk Phosphatase: 74 U/L (ref 35–117)
Anion Gap: 7 mmol/L (ref 2–17)
BUN: 14 mg/dL (ref 8–23)
CALCIUM,CORRECTED,CCA: 9.2 mg/dL (ref 8.8–10.2)
CO2: 30 mmol/L — ABNORMAL HIGH (ref 22–29)
Calcium: 8.4 mg/dL — ABNORMAL LOW (ref 8.8–10.2)
Chloride: 100 mmol/L (ref 98–107)
Creatinine: 0.5 mg/dL (ref 0.5–1.0)
GFR African American: 110 mL/min/{1.73_m2} (ref >=90)
GFR Non-African American: 95 mL/min/{1.73_m2} (ref >=90)
Globulin: 2.6 g/dL (ref 1.9–4.4)
Glucose: 94 mg/dL (ref 70–99)
OSMOLALITY CALCULATED: 272 mOsm/kg (ref 270–287)
Potassium: 3.6 mmol/L (ref 3.5–5.3)
Sodium: 136 mmol/L (ref 135–145)
Total Bilirubin: 0.53 mg/dL (ref 0.00–1.20)
Total Protein: 5.6 g/dL — ABNORMAL LOW (ref 6.4–8.3)

## 2020-06-27 LAB — CBC WITH AUTO DIFFERENTIAL
Hematocrit: 20.8 % — CL (ref 34.0–47.0)
Hemoglobin: 7 g/dL — CL (ref 11.5–15.7)
MCH: 32.4 pg (ref 27.0–34.5)
MCHC: 33.7 g/dL (ref 32.0–36.0)
MCV: 96.3 fL (ref 81.0–99.0)
MPV: 11.9 fL (ref 7.2–13.2)
NRBC Absolute: 0.03 10*3/uL — ABNORMAL HIGH (ref 0.000–0.012)
NRBC Automated: 2 % — ABNORMAL HIGH (ref 0.0–0.2)
Platelets: 188 10*3/uL (ref 140–440)
RBC: 2.16 x10e6/mcL — ABNORMAL LOW (ref 3.60–5.20)
RDW: 17.6 % — ABNORMAL HIGH (ref 11.0–16.0)
WBC: 1.5 10*3/uL — CL (ref 3.8–10.6)

## 2020-06-27 LAB — CMV DNA, QUANTITATIVE, PCR: CMV DNA, PCR IU/mL: NEGATIVE IU/mL

## 2020-06-27 LAB — PHOSPHORUS: Phosphorus: 2.5 mg/dL (ref 2.5–4.5)

## 2020-06-27 LAB — LACTATE DEHYDROGENASE: LD: 507 U/L — ABNORMAL HIGH (ref 135–214)

## 2020-06-27 NOTE — Case Communication (Signed)
CM Discharge Planning Assessment - Text       CM Progress Note Entered On:  06/27/2020 15:44 EST    Performed On:  06/27/2020 15:42 EST by Robynn Pane M-RN               CM Progress Note   CM Home/Lay Caregiver Name/Relationship :   Spouse:  Maeghan Canny    [phone]   CM Initial Tentative Discharge Plan :   Home with services to be determined   CM Progress Note :   Attempted CM assessment 06/05/2020 and 06/06/2020, will reattempt 06/07/2020.  BPaulson RN CM    06/07/2020:  Met with pt/spouse at bedside.  Pt lives with spouse:  Christinia Lambeth [phone].  Independent with ADLS, PTA.  DME:  Shower chair.  STE: 3.  Pt and spouse understand 'plan with Dr Bobette Mo and to be here several weeks for treatment.'  Pt and spouse aware that CM will continue to follow for discharge planning needs.  BPaulson RN    06/11/2020:  Reviewed chart:  Referred pt to [facility] PT, per in-pt PT recomendation.  CM will continue to follow.  BPaulson RN CM    06/13/2020:  Met with pt at bedside.  P.T. has ordered 3-in-1 BSC and WW upon discharge.  CM notified Sherilyn Cooter NP for this request and will continue to follow for discharge planning needs.   BPaulson RN CM    06/15/2020:  Reviewed chart.  Pt ambulating in hallway.  CM will follow up with pt on 06/18/2020 for ordering DME.   BPaulson RN CM    06/18/2020:  Reviewed chart.  Spoke with Sherilyn Cooter RE:  d'c plan with potential DME.  Per oncology:   Pt will be inpt x 10 more days/pt is improving with ambulation/strength - DME order will be considered prior to discharge.  Medicare IM explained, signed and placed in chart.  BPaulson RN CM    06/21/2020:  Reivewed chart.   Pt is febrile, not medically ready for discharge.  Entered in [place] with RN with [name] for discharge planning.  CM continues to follow.  PT will be in [place], followed by Oncology until ~ 06/28/2020/currently followed by ID for fevers.  BPaulson RN CM    06/22/2020:  Reviewed chart.  Pt in [place] thru w/e.  Attempted to  update Medicare IM, pt alseep and unable to reach spouse via tele.  BPaulson RN CM    06/26/2020:  Reviewed chart.  Pt continues treatment with Oncology/ID following.  Medicare IM signed, placed in chart.  There are no dicharge needs identified at this time.  Pt is ambulating and 'does not feel that she needs any HH/DME upon discharge.'  BPaulson RN CM    06/27/2020:  Spouse requests 'telephone number for [facility] 'business office.'  Spouse referred to the following tele number:  [phone].  BPaulson RN CM     Robynn Pane M-RN - 06/27/2020 15:42 EST

## 2020-06-28 LAB — ABO/RH: ABO/Rh: B NEG

## 2020-06-28 LAB — LACTATE DEHYDROGENASE: LD: 521 U/L — ABNORMAL HIGH (ref 135–214)

## 2020-06-28 LAB — CBC WITH AUTO DIFFERENTIAL
Hematocrit: 25.6 % — ABNORMAL LOW (ref 34.0–47.0)
Hemoglobin: 8.5 g/dL — ABNORMAL LOW (ref 11.5–15.7)
MCH: 31.7 pg (ref 27.0–34.5)
MCHC: 33.2 g/dL (ref 32.0–36.0)
MCV: 95.5 fL (ref 81.0–99.0)
MPV: 12 fL (ref 7.2–13.2)
NRBC Absolute: 0.05 10*3/uL — ABNORMAL HIGH (ref 0.000–0.012)
NRBC Automated: 2.7 % — ABNORMAL HIGH (ref 0.0–0.2)
Platelets: 206 10*3/uL (ref 140–440)
RBC: 2.68 x10e6/mcL — ABNORMAL LOW (ref 3.60–5.20)
RDW: 16.9 % — ABNORMAL HIGH (ref 11.0–16.0)
WBC: 1.8 10*3/uL — CL (ref 3.8–10.6)

## 2020-06-28 LAB — COMPREHENSIVE METABOLIC PANEL
ALT: 21 U/L (ref 0–33)
AST: 14 U/L (ref 0–32)
Albumin/Globulin Ratio: 1.1 mmol/L (ref 1.00–2.70)
Albumin: 3.1 g/dL — ABNORMAL LOW (ref 3.5–5.2)
Alk Phosphatase: 74 U/L (ref 35–117)
Anion Gap: 9 mmol/L (ref 2–17)
BUN: 13 mg/dL (ref 8–23)
CALCIUM,CORRECTED,CCA: 8.8 mg/dL (ref 8.8–10.2)
CO2: 28 mmol/L (ref 22–29)
Calcium: 8.1 mg/dL — ABNORMAL LOW (ref 8.8–10.2)
Chloride: 100 mmol/L (ref 98–107)
Creatinine: 0.4 mg/dL — ABNORMAL LOW (ref 0.5–1.0)
GFR African American: 119 mL/min/{1.73_m2} (ref >=90)
GFR Non-African American: 103 mL/min/{1.73_m2} (ref >=90)
Globulin: 2.9 g/dL (ref 1.9–4.4)
Glucose: 105 mg/dL — ABNORMAL HIGH (ref 70–99)
OSMOLALITY CALCULATED: 274 mOsm/kg (ref 270–287)
Potassium: 3.5 mmol/L (ref 3.5–5.3)
Sodium: 137 mmol/L (ref 135–145)
Total Bilirubin: 0.68 mg/dL (ref 0.00–1.20)
Total Protein: 6 g/dL — ABNORMAL LOW (ref 6.4–8.3)

## 2020-06-28 LAB — DIFFERENTIAL, MANUAL
Absolute Lymph #: 0.3 10*3/uL — ABNORMAL LOW (ref 1.0–3.2)
Absolute Mono #: 0.7 10*3/uL (ref 0.3–1.0)
Bands Relative: 1 % (ref 0–5)
Basophils %: 1 % (ref 0–2)
Blasts Absolute: 0.1 10*3/uL
Blasts Relative: 7 % — ABNORMAL HIGH (ref 0–0)
Eosinophils %: 2 % (ref 0–7)
Lymphocytes: 19 % (ref 15–45)
Monocytes: 41 % — ABNORMAL HIGH (ref 4–12)
Myelocyte Percent: 1 %
Neutrophils %. Manual count: 26 % — ABNORMAL LOW (ref 42–74)
Neutrophils Absolute: 0.5 10*3/uL — ABNORMAL LOW (ref 1.6–7.3)
Nucleated RBCs: 3 100WBC — ABNORMAL HIGH (ref 0–0)
Platelet Estimate: ADEQUATE
RBC Morphology: ABNORMAL — AB
Reactive Lymphocytes: 2 %

## 2020-06-28 LAB — URINALYSIS WITH REFLEX TO CULTURE
Bilirubin Urine: NEGATIVE
Blood, Urine: NEGATIVE
Glucose, UA: NEGATIVE mg/dL
Hyaline Casts, UA: NONE SEEN /LPF
Ketones, Urine: NEGATIVE mg/dL
Leukocyte Esterase, Urine: NEGATIVE
Nitrite, Urine: NEGATIVE
Protein, UA: 100 — AB
Specific Gravity, UA: 1.013 (ref 1.003–1.035)
Urobilinogen, Urine: 1 EU/dL
pH, UA: 8 (ref 4.5–8.0)

## 2020-06-28 LAB — PHOSPHORUS: Phosphorus: 2.7 mg/dL (ref 2.5–4.5)

## 2020-06-28 LAB — PROCALCITONIN: Procalcitonin: 0.11 ng/mL (ref <=0.24)

## 2020-06-28 LAB — ANTIBODY SCREEN: Antibody Screen: NEGATIVE

## 2020-06-28 NOTE — Nursing Note (Signed)
Vascular Access Nurse Note               ULTRASOUND GUIDED PERIPHERAL IV PLACED  0.77ml of 1% LIDOCAINE USED INTRADERMALLY TO ANESTHETIZE INSERTION SITE  RIGHT MID FOREARM VEIN  22 GAUGE  1.75 INCHES  X 1 ATTEMPT  FLUSHED WITH NORMAL SALINE  NO COMPLICATIONS  INSERTED BY J.STONE, RN   LEFT ARM NO PROCEDURE R/T DVT    Signature Line

## 2020-06-28 NOTE — Progress Notes (Signed)
Pharmacy Clinical Interventions - Text       Pharmacy Clinical Interventions Entered On:  06/28/2020 20:28 EST    Performed On:  06/28/2020 20:27 EST by Delton [place]. D., Renae               Interventions   Intervention Type Pharmacy :   Anticoagulation Monitoring   Associated Order(s) Pharmacy :   lovenox   Clinical Importance Pharmacy :   Routine safety/clinical monitoring with no change needed   Pharmacy Order Initiated By :   Pharmacist   Medication Safety Reporting Pharmacy :   Non Medication Event   Pharmacy Additional Information :   Ordered as 0.75mg /kg; awaiting clarification from NP/MD; will verify as is for now   Pharmacist Intervention Time :   6-15 Minutes   [place], [place]. D., Renae - 06/28/2020 20:27 EST

## 2020-06-29 DIAGNOSIS — M79602 Pain in left arm: Secondary | ICD-10-CM | POA: Diagnosis not present

## 2020-06-29 LAB — CBC WITH AUTO DIFFERENTIAL
Absolute Baso #: 0 10*3/uL (ref 0.0–0.2)
Absolute Eos #: 0 10*3/uL (ref 0.0–0.5)
Absolute Lymph #: 0.9 10*3/uL — ABNORMAL LOW (ref 1.0–3.2)
Absolute Mono #: 1.1 10*3/uL — ABNORMAL HIGH (ref 0.3–1.0)
Basophils %: 0.4 % (ref 0.0–2.0)
Eosinophils %: 0 % (ref 0.0–7.0)
Hematocrit: 27.2 % — ABNORMAL LOW (ref 34.0–47.0)
Hemoglobin: 8.9 g/dL — ABNORMAL LOW (ref 11.5–15.7)
Immature Grans (Abs): 0.21 10*3/uL — ABNORMAL HIGH (ref 0.00–0.06)
Immature Granulocytes: 7.4 % — ABNORMAL HIGH (ref 0.0–0.6)
Lymphocytes: 30.6 % (ref 15.0–45.0)
MCH: 31.6 pg (ref 27.0–34.5)
MCHC: 32.7 g/dL (ref 32.0–36.0)
MCV: 96.5 fL (ref 81.0–99.0)
MPV: 12.2 fL (ref 7.2–13.2)
Monocytes: 40.1 % — ABNORMAL HIGH (ref 4.0–12.0)
NRBC Absolute: 0.08 10*3/uL — ABNORMAL HIGH (ref 0.000–0.012)
NRBC Automated: 2.8 % — ABNORMAL HIGH (ref 0.0–0.2)
Neutrophils %: 21.5 % — ABNORMAL LOW (ref 42.0–74.0)
Neutrophils Absolute: 0.6 10*3/uL — ABNORMAL LOW (ref 1.6–7.3)
Platelets: 241 10*3/uL (ref 140–440)
RBC: 2.82 x10e6/mcL — ABNORMAL LOW (ref 3.60–5.20)
RDW: 17.1 % — ABNORMAL HIGH (ref 11.0–16.0)
WBC: 2.8 10*3/uL — ABNORMAL LOW (ref 3.8–10.6)

## 2020-06-29 LAB — COMPREHENSIVE METABOLIC PANEL
ALT: 20 U/L (ref 0–33)
AST: 21 U/L (ref 0–32)
Albumin/Globulin Ratio: 0.8 mmol/L — ABNORMAL LOW (ref 1.00–2.70)
Albumin: 3.1 g/dL — ABNORMAL LOW (ref 3.5–5.2)
Alk Phosphatase: 79 U/L (ref 35–117)
Anion Gap: 9 mmol/L (ref 2–17)
BUN: 12 mg/dL (ref 8–23)
CALCIUM,CORRECTED,CCA: 9.2 mg/dL (ref 8.8–10.2)
CO2: 28 mmol/L (ref 22–29)
Calcium: 8.5 mg/dL — ABNORMAL LOW (ref 8.8–10.2)
Chloride: 102 mmol/L (ref 98–107)
Creatinine: 0.5 mg/dL (ref 0.5–1.0)
GFR African American: 110 mL/min/{1.73_m2} (ref >=90)
GFR Non-African American: 95 mL/min/{1.73_m2} (ref >=90)
Globulin: 3.7 g/dL (ref 1.9–4.4)
Glucose: 95 mg/dL (ref 70–99)
OSMOLALITY CALCULATED: 277 mOsm/kg (ref 270–287)
Potassium: 4.1 mmol/L (ref 3.5–5.3)
Sodium: 139 mmol/L (ref 135–145)
Total Bilirubin: 0.6 mg/dL (ref 0.00–1.20)
Total Protein: 6.8 g/dL (ref 6.4–8.3)

## 2020-06-29 LAB — LACTATE DEHYDROGENASE: LD: 672 U/L — ABNORMAL HIGH (ref 135–214)

## 2020-06-29 LAB — PHOSPHORUS: Phosphorus: 3.1 mg/dL (ref 2.5–4.5)

## 2020-06-29 LAB — CBC+HEMATOLOGY REVIEW

## 2020-06-29 NOTE — Progress Notes (Signed)
PT Time Spent with Patient Acute/OP-Text       PT Time Spent With Patient Outpatient/Acute Entered On:  06/29/2020 11:38 EST    Performed On:  06/29/2020 11:38 EST by GADSDEN, PTA, ANTWAN D               Time Spent With Patient   PT Functional Training Time :   0 minutes   PTA Functional Training Units :   0 units   PT Total Timed Code Treatment Units :   0 units   PT Total Timed Code Min :   0    PT Total Treatment Time Acute/OP :   0    GADSDEN, PTA, ANTWAN D - 06/29/2020 11:38 EST   PT Units Cancelled Missed     PT Units Lost #1          Amount :    0               Reason :    Fatigue/Unable to engage  (Comment: Pt and family declined PT today. requested to check back in am due to fatigue.  Candelaria Celeste D - 06/29/2020 11:38 EST] )               Cherie Ouch, Melanee Spry D - 06/29/2020 11:38 EST

## 2020-06-29 NOTE — Nursing Note (Signed)
Vascular Access Nurse Note               ULTRASOUND GUIDED PICC LINE PLACEMENT    INDICATION: CHEMO    PROCEDURE PERFORMED BY [place], THERESA RN [facility]    PROCEDURE:    After informed, written consent, A preprocedure timeout was performed.    Arm circumference at insertion site was 26cm.  The patient's RIGHT UPPER ARM was prepped and draped  in sterile fashion.   Local anesthesia was obtained with 0.5cc 1% lidocaine intradermally. Using ultrasound guidance, the BASILIC vein  was accessed using micropuncture technique x1 attempt.  A guide wire  was introduced into the needle.  The needle was removed, a 5.5 French peel-away sheath was placed over the wire   and then the wire was removed.  A 34 cm, DUAL lumen catheter (ARROW lot# [id number]) was advanced through the peel-away sheath and the sheath was removed.   The tip of the catheter terminates in the SVC/CAJ per vascular positioning system, utilizing the teleflex guide wire.   The catheter functions well and is ready for use. A biopatch was placed around the catheter and was secured to the skin with a stat lock.   A sterile dressing was applied.  Each catheter was flushed with 10ml normal saline. No Complications.      PROCEDURE START TIME: 1051    PROCEDURE END TIME:  1108    PATIENT TOLERATED WELL  LEFT ARM LIMB ALERT FOR DVT    Signature Line

## 2020-06-30 LAB — CBC WITH AUTO DIFFERENTIAL
Absolute Baso #: 0 10*3/uL (ref 0.0–0.2)
Absolute Eos #: 0 10*3/uL (ref 0.0–0.5)
Absolute Lymph #: 0.6 10*3/uL — ABNORMAL LOW (ref 1.0–3.2)
Absolute Mono #: 1.1 10*3/uL — ABNORMAL HIGH (ref 0.3–1.0)
Basophils %: 0.4 % (ref 0.0–2.0)
Eosinophils %: 0 % (ref 0.0–7.0)
Hematocrit: 24 % — ABNORMAL LOW (ref 34.0–47.0)
Hemoglobin: 7.9 g/dL — ABNORMAL LOW (ref 11.5–15.7)
Immature Grans (Abs): 0.15 10*3/uL — ABNORMAL HIGH (ref 0.00–0.06)
Immature Granulocytes: 6.1 % — ABNORMAL HIGH (ref 0.0–0.6)
Lymphocytes: 23.5 % (ref 15.0–45.0)
MCH: 31.6 pg (ref 27.0–34.5)
MCHC: 32.9 g/dL (ref 32.0–36.0)
MCV: 96 fL (ref 81.0–99.0)
MPV: 12.3 fL (ref 7.2–13.2)
Monocytes: 42.9 % — ABNORMAL HIGH (ref 4.0–12.0)
NRBC Absolute: 0.06 10*3/uL — ABNORMAL HIGH (ref 0.000–0.012)
NRBC Automated: 2.4 % — ABNORMAL HIGH (ref 0.0–0.2)
Neutrophils %: 27.1 % — ABNORMAL LOW (ref 42.0–74.0)
Neutrophils Absolute: 0.7 10*3/uL — ABNORMAL LOW (ref 1.6–7.3)
Platelets: 237 10*3/uL (ref 140–440)
RBC: 2.5 x10e6/mcL — ABNORMAL LOW (ref 3.60–5.20)
RDW: 17.2 % — ABNORMAL HIGH (ref 11.0–16.0)
WBC: 2.5 10*3/uL — ABNORMAL LOW (ref 3.8–10.6)

## 2020-06-30 LAB — COMPREHENSIVE METABOLIC PANEL
ALT: 17 U/L (ref 0–33)
AST: 15 U/L (ref 0–32)
Albumin/Globulin Ratio: 1.1 mmol/L (ref 1.00–2.70)
Albumin: 2.9 g/dL — ABNORMAL LOW (ref 3.5–5.2)
Alk Phosphatase: 67 U/L (ref 35–117)
Anion Gap: 11 mmol/L (ref 2–17)
BUN: 10 mg/dL (ref 8–23)
CALCIUM,CORRECTED,CCA: 8.7 mg/dL — ABNORMAL LOW (ref 8.8–10.2)
CO2: 27 mmol/L (ref 22–29)
Calcium: 7.8 mg/dL — ABNORMAL LOW (ref 8.8–10.2)
Chloride: 100 mmol/L (ref 98–107)
Creatinine: 0.4 mg/dL — ABNORMAL LOW (ref 0.5–1.0)
GFR African American: 119 mL/min/{1.73_m2} (ref >=90)
GFR Non-African American: 103 mL/min/{1.73_m2} (ref >=90)
Globulin: 2.6 g/dL (ref 1.9–4.4)
Glucose: 96 mg/dL (ref 70–99)
OSMOLALITY CALCULATED: 274 mOsm/kg (ref 270–287)
Potassium: 3.4 mmol/L — ABNORMAL LOW (ref 3.5–5.3)
Sodium: 138 mmol/L (ref 135–145)
Total Bilirubin: 0.45 mg/dL (ref 0.00–1.20)
Total Protein: 5.5 g/dL — ABNORMAL LOW (ref 6.4–8.3)

## 2020-06-30 LAB — PHOSPHORUS: Phosphorus: 2.8 mg/dL (ref 2.5–4.5)

## 2020-06-30 LAB — ASP GALACTOMANNAN AG: Aspergillus Galacto AG: 0.03 Index (ref 0.00–0.49)

## 2020-06-30 LAB — PROCALCITONIN: Procalcitonin: 0.14 ng/mL (ref <=0.24)

## 2020-06-30 LAB — FUNGITELL: Fungitell (Beta D-Glucan): 31 pg/mL (ref <80)

## 2020-06-30 LAB — URIC ACID: Uric Acid: 2.9 mg/dL (ref 2.4–5.7)

## 2020-06-30 LAB — LACTATE DEHYDROGENASE: LD: 571 U/L — ABNORMAL HIGH (ref 135–214)

## 2020-07-01 LAB — COMPREHENSIVE METABOLIC PANEL
ALT: 14 U/L (ref 0–33)
AST: 14 U/L (ref 0–32)
Albumin/Globulin Ratio: 1.1 mmol/L (ref 1.00–2.70)
Albumin: 2.8 g/dL — ABNORMAL LOW (ref 3.5–5.2)
Alk Phosphatase: 66 U/L (ref 35–117)
Anion Gap: 8 mmol/L (ref 2–17)
BUN: 9 mg/dL (ref 8–23)
CALCIUM,CORRECTED,CCA: 8.8 mg/dL (ref 8.8–10.2)
CO2: 29 mmol/L (ref 22–29)
Calcium: 7.8 mg/dL — ABNORMAL LOW (ref 8.8–10.2)
Chloride: 99 mmol/L (ref 98–107)
Creatinine: 0.4 mg/dL — ABNORMAL LOW (ref 0.5–1.0)
GFR African American: 119 mL/min/{1.73_m2} (ref >=90)
GFR Non-African American: 103 mL/min/{1.73_m2} (ref >=90)
Globulin: 2.6 g/dL (ref 1.9–4.4)
Glucose: 95 mg/dL (ref 70–99)
OSMOLALITY CALCULATED: 270 mOsm/kg (ref 270–287)
Potassium: 3.5 mmol/L (ref 3.5–5.3)
Sodium: 136 mmol/L (ref 135–145)
Total Bilirubin: 0.46 mg/dL (ref 0.00–1.20)
Total Protein: 5.4 g/dL — ABNORMAL LOW (ref 6.4–8.3)

## 2020-07-01 LAB — CBC WITH AUTO DIFFERENTIAL
Hematocrit: 23.3 % — ABNORMAL LOW (ref 34.0–47.0)
Hemoglobin: 7.6 g/dL — ABNORMAL LOW (ref 11.5–15.7)
MCH: 32.1 pg (ref 27.0–34.5)
MCHC: 32.6 g/dL (ref 32.0–36.0)
MCV: 98.3 fL (ref 81.0–99.0)
MPV: 12 fL (ref 7.2–13.2)
NRBC Absolute: 0.02 10*3/uL — ABNORMAL HIGH (ref 0.000–0.012)
NRBC Automated: 1 % — ABNORMAL HIGH (ref 0.0–0.2)
Platelets: 224 10*3/uL (ref 140–440)
RBC: 2.37 x10e6/mcL — ABNORMAL LOW (ref 3.60–5.20)
RDW: 17.5 % — ABNORMAL HIGH (ref 11.0–16.0)
WBC: 2 10*3/uL — CL (ref 3.8–10.6)

## 2020-07-01 LAB — DIFFERENTIAL, MANUAL
Absolute Lymph #: 0.5 10*3/uL — ABNORMAL LOW (ref 1.0–3.2)
Absolute Mono #: 0.5 10*3/uL (ref 0.3–1.0)
Basophils %: 2 % (ref 0–2)
Lymphocytes: 23 % (ref 15–45)
Monocytes: 23 % — ABNORMAL HIGH (ref 4–12)
Neutrophils %. Manual count: 52 % (ref 42–74)
Neutrophils Absolute: 1 10*3/uL — ABNORMAL LOW (ref 1.6–7.3)
Platelet Estimate: ADEQUATE
RBC Morphology: NORMAL

## 2020-07-01 LAB — LACTATE DEHYDROGENASE: LD: 562 U/L — ABNORMAL HIGH (ref 135–214)

## 2020-07-01 LAB — OCCULT BLOOD, FECAL
Lot/Kit Number: 51402
Occult Blood Fecal: NEGATIVE

## 2020-07-01 LAB — PHOSPHORUS: Phosphorus: 3.1 mg/dL (ref 2.5–4.5)

## 2020-07-01 LAB — URIC ACID: Uric Acid: 2.9 mg/dL (ref 2.4–5.7)

## 2020-07-02 LAB — COMPREHENSIVE METABOLIC PANEL
ALT: 15 U/L (ref 0–33)
AST: 18 U/L (ref 0–32)
Albumin/Globulin Ratio: 1 mmol/L (ref 1.00–2.70)
Albumin: 2.8 g/dL — ABNORMAL LOW (ref 3.5–5.2)
Alk Phosphatase: 58 U/L (ref 35–117)
Anion Gap: 9 mmol/L (ref 2–17)
BUN: 9 mg/dL (ref 8–23)
CALCIUM,CORRECTED,CCA: 9 mg/dL (ref 8.8–10.2)
CO2: 28 mmol/L (ref 22–29)
Calcium: 8 mg/dL — ABNORMAL LOW (ref 8.8–10.2)
Chloride: 100 mmol/L (ref 98–107)
Creatinine: 0.4 mg/dL — ABNORMAL LOW (ref 0.5–1.0)
GFR African American: 119 mL/min/{1.73_m2} (ref >=90)
GFR Non-African American: 103 mL/min/{1.73_m2} (ref >=90)
Globulin: 2.8 g/dL (ref 1.9–4.4)
Glucose: 96 mg/dL (ref 70–99)
OSMOLALITY CALCULATED: 272 mOsm/kg (ref 270–287)
Potassium: 3.3 mmol/L — ABNORMAL LOW (ref 3.5–5.3)
Sodium: 137 mmol/L (ref 135–145)
Total Bilirubin: 0.44 mg/dL (ref 0.00–1.20)
Total Protein: 5.6 g/dL — ABNORMAL LOW (ref 6.4–8.3)

## 2020-07-02 LAB — DIFFERENTIAL, MANUAL
Absolute Bands #: 0.1 10*3/uL
Absolute Lymph #: 0.3 10*3/uL — ABNORMAL LOW (ref 1.0–3.2)
Absolute Mono #: 0.4 10*3/uL (ref 0.3–1.0)
Bands Relative: 3 % (ref 0–5)
Blasts Absolute: 0.1 10*3/uL
Blasts Relative: 3 % — ABNORMAL HIGH (ref 0–0)
Lymphocytes: 16 % (ref 15–45)
Monocytes: 26 % — ABNORMAL HIGH (ref 4–12)
Neutrophils %. Manual count: 52 % (ref 42–74)
Neutrophils Absolute: 0.9 10*3/uL — ABNORMAL LOW (ref 1.6–7.3)
Nucleated RBCs: 6 100WBC — ABNORMAL HIGH (ref 0–0)
Platelet Estimate: ADEQUATE
RBC Morphology: ABNORMAL — AB

## 2020-07-02 LAB — LACTATE DEHYDROGENASE: LD: 538 U/L — ABNORMAL HIGH (ref 135–214)

## 2020-07-02 LAB — OCCULT BLOOD, FECAL
Lot/Kit Number: 51402
Occult Blood Fecal: NEGATIVE

## 2020-07-02 LAB — CBC WITH AUTO DIFFERENTIAL
Hematocrit: 22 % — ABNORMAL LOW (ref 34.0–47.0)
Hemoglobin: 7.1 g/dL — ABNORMAL LOW (ref 11.5–15.7)
MCH: 31.7 pg (ref 27.0–34.5)
MCHC: 32.3 g/dL (ref 32.0–36.0)
MCV: 98.2 fL (ref 81.0–99.0)
MPV: 12 fL (ref 7.2–13.2)
NRBC Absolute: 0.05 10*3/uL — ABNORMAL HIGH (ref 0.000–0.012)
NRBC Automated: 2.9 % — ABNORMAL HIGH (ref 0.0–0.2)
Platelets: 243 10*3/uL (ref 140–440)
RBC: 2.24 x10e6/mcL — ABNORMAL LOW (ref 3.60–5.20)
RDW: 17.7 % — ABNORMAL HIGH (ref 11.0–16.0)
WBC: 1.7 10*3/uL — CL (ref 3.8–10.6)

## 2020-07-02 LAB — ABO/RH: ABO/Rh: B NEG

## 2020-07-02 LAB — PHOSPHORUS: Phosphorus: 3 mg/dL (ref 2.5–4.5)

## 2020-07-02 LAB — ANTIBODY SCREEN: Antibody Screen: NEGATIVE

## 2020-07-02 LAB — URIC ACID: Uric Acid: 2.9 mg/dL (ref 2.4–5.7)

## 2020-07-02 NOTE — Progress Notes (Signed)
 PT Time Spent with Patient Acute/OP-Text       PT Time Spent With Patient Outpatient/Acute Entered On:  07/02/2020 10:01 EDT    Performed On:  07/02/2020 10:00 EDT by MILLEN, PTA, SANDRA L               Time Spent With Patient   PT Time In :   9:52 EST   PT Functional Training Time :   0 minutes   PT Total Timed Code Min :   0    PT Total Treatment Time Acute/OP :   0    MILLEN, PTA, SANDRA L - 07/02/2020 10:00 EDT   PT Units Cancelled Missed     PT Units Lost #1          Amount :    1               Reason :    Fatigue/Unable to engage  (Comment: pt reports she is feeling too weak from chemo  this day  [MILLEN, PTA, SANDRA L - 07/02/2020 10:00 EDT] )               MILLEN, PTA, SANDRA L - 07/02/2020 10:00 EDT

## 2020-07-02 NOTE — Case Communication (Signed)
CM Discharge Planning Assessment - Text       CM Progress Note Entered On:  07/02/2020 13:25 EDT    Performed On:  07/02/2020 13:24 EDT by Ophelia Shoulder Ann-RN               CM Progress Note   CM Home/Lay Caregiver Name/Relationship :   Spouse:  Ginna Schuur    [phone]   CM Initial Tentative Discharge Plan :   Home with services to be determined   CM Progress Note :   Attempted CM assessment 06/05/2020 and 06/06/2020, will reattempt 06/07/2020.  BPaulson RN CM    06/07/2020:  Met with pt/spouse at bedside.  Pt lives with spouse:  Allisyn Kunz [phone].  Independent with ADLS, PTA.  DME:  Shower chair.  STE: 3.  Pt and spouse understand 'plan with Dr Bobette Mo and to be here several weeks for treatment.'  Pt and spouse aware that CM will continue to follow for discharge planning needs.  BPaulson RN    06/11/2020:  Reviewed chart:  Referred pt to [facility] PT, per in-pt PT recomendation.  CM will continue to follow.  BPaulson RN CM    06/13/2020:  Met with pt at bedside.  P.T. has ordered 3-in-1 BSC and WW upon discharge.  CM notified Sherilyn Cooter NP for this request and will continue to follow for discharge planning needs.   BPaulson RN CM    06/15/2020:  Reviewed chart.  Pt ambulating in hallway.  CM will follow up with pt on 06/18/2020 for ordering DME.   BPaulson RN CM    06/18/2020:  Reviewed chart.  Spoke with Sherilyn Cooter RE:  d'c plan with potential DME.  Per oncology:   Pt will be inpt x 10 more days/pt is improving with ambulation/strength - DME order will be considered prior to discharge.  Medicare IM explained, signed and placed in chart.  BPaulson RN CM    06/21/2020:  Reivewed chart.   Pt is febrile, not medically ready for discharge.  Entered in [place] with RN with [name] for discharge planning.  CM continues to follow.  PT will be in [place], followed by Oncology until ~ 06/28/2020/currently followed by ID for fevers.  BPaulson RN CM    06/22/2020:  Reviewed chart.  Pt in [place] thru w/e.  Attempted  to update Medicare IM, pt alseep and unable to reach spouse via tele.  BPaulson RN CM    06/26/2020:  Reviewed chart.  Pt continues treatment with Oncology/ID following.  Medicare IM signed, placed in chart.  There are no dicharge needs identified at this time.  Pt is ambulating and 'does not feel that she needs any HH/DME upon discharge.'  BPaulson RN CM    06/27/2020:  Spouse requests 'telephone number for [facility] 'business office.'  Spouse referred to the following tele number:  [phone].  BPaulson RN CM    07/02/20 TS CM reviewed notes. Pt running fevers, PICC line removed, but cxs good so far. ID will cont dapto and monitor/trend any fevers. CM will cont to follow.      Ophelia Shoulder Ann-RN - 07/02/2020 13:24 EDT

## 2020-07-03 LAB — COMPREHENSIVE METABOLIC PANEL
ALT: 14 U/L (ref 0–33)
AST: 18 U/L (ref 0–32)
Albumin/Globulin Ratio: 1.2 mmol/L (ref 1.00–2.70)
Albumin: 2.8 g/dL — ABNORMAL LOW (ref 3.5–5.2)
Alk Phosphatase: 59 U/L (ref 35–117)
Anion Gap: 7 mmol/L (ref 2–17)
BUN: 8 mg/dL (ref 8–23)
CALCIUM,CORRECTED,CCA: 8.8 mg/dL (ref 8.8–10.2)
CO2: 29 mmol/L (ref 22–29)
Calcium: 7.8 mg/dL — ABNORMAL LOW (ref 8.8–10.2)
Chloride: 101 mmol/L (ref 98–107)
Creatinine: 0.3 mg/dL — ABNORMAL LOW (ref 0.5–1.0)
GFR African American: 131 mL/min/{1.73_m2} (ref >=90)
GFR Non-African American: 113 mL/min/{1.73_m2} (ref >=90)
Globulin: 2.3 g/dL (ref 1.9–4.4)
Glucose: 100 mg/dL — ABNORMAL HIGH (ref 70–99)
OSMOLALITY CALCULATED: 272 mOsm/kg (ref 270–287)
Potassium: 3.6 mmol/L (ref 3.5–5.3)
Sodium: 137 mmol/L (ref 135–145)
Total Bilirubin: 0.57 mg/dL (ref 0.00–1.20)
Total Protein: 5.1 g/dL — ABNORMAL LOW (ref 6.4–8.3)

## 2020-07-03 LAB — URINALYSIS WITH REFLEX TO CULTURE
Amorphous, UA: NONE SEEN /HPF
Bilirubin Urine: NEGATIVE
Blood, Urine: NEGATIVE
Glucose, UA: NEGATIVE mg/dL
Ketones, Urine: NEGATIVE mg/dL
Leukocyte Esterase, Urine: NEGATIVE
MUCUS, URINE: NONE SEEN /LPF
Nitrite, Urine: NEGATIVE
RBC, UA: NONE SEEN /HPF (ref 0–2)
Specific Gravity, UA: 1.009 (ref 1.003–1.035)
Urobilinogen, Urine: 1 EU/dL
pH, UA: 7 (ref 4.5–8.0)

## 2020-07-03 LAB — OCCULT BLOOD, FECAL
Lot/Kit Number: 51402
Occult Blood Fecal: NEGATIVE

## 2020-07-03 LAB — ASP GALACTOMANNAN AG: Aspergillus Galacto AG: 0.02 Index (ref 0.00–0.49)

## 2020-07-03 LAB — CBC WITH AUTO DIFFERENTIAL
Absolute Baso #: 0 10*3/uL (ref 0.0–0.2)
Absolute Eos #: 0 10*3/uL (ref 0.0–0.5)
Absolute Lymph #: 0.3 10*3/uL — ABNORMAL LOW (ref 1.0–3.2)
Absolute Mono #: 0.2 10*3/uL — ABNORMAL LOW (ref 0.3–1.0)
Basophils %: 0.8 % (ref 0.0–2.0)
Eosinophils %: 0 % (ref 0.0–7.0)
Hematocrit: 24.6 % — ABNORMAL LOW (ref 34.0–47.0)
Hemoglobin: 8.3 g/dL — ABNORMAL LOW (ref 11.5–15.7)
Immature Grans (Abs): 0.05 10*3/uL (ref 0.00–0.06)
Immature Granulocytes: 4 % — ABNORMAL HIGH (ref 0.0–0.6)
Lymphocytes: 22.2 % (ref 15.0–45.0)
MCH: 32 pg (ref 27.0–34.5)
MCHC: 33.7 g/dL (ref 32.0–36.0)
MCV: 95 fL (ref 81.0–99.0)
MPV: 11.4 fL (ref 7.2–13.2)
Monocytes: 12.7 % — ABNORMAL HIGH (ref 4.0–12.0)
NRBC Absolute: 0 10*3/uL (ref 0.000–0.012)
NRBC Automated: 0 % (ref 0.0–0.2)
Neutrophils %: 60.3 % (ref 42.0–74.0)
Neutrophils Absolute: 0.8 10*3/uL — ABNORMAL LOW (ref 1.6–7.3)
Platelets: 201 10*3/uL (ref 140–440)
RBC: 2.59 x10e6/mcL — ABNORMAL LOW (ref 3.60–5.20)
RDW: 17 % — ABNORMAL HIGH (ref 11.0–16.0)
WBC: 1.3 10*3/uL — CL (ref 3.8–10.6)

## 2020-07-03 LAB — URIC ACID: Uric Acid: 2.6 mg/dL (ref 2.4–5.7)

## 2020-07-03 LAB — LACTATE DEHYDROGENASE: LD: 528 U/L — ABNORMAL HIGH (ref 135–214)

## 2020-07-03 LAB — PHOSPHORUS: Phosphorus: 3.1 mg/dL (ref 2.5–4.5)

## 2020-07-03 NOTE — Case Communication (Signed)
CM Discharge Planning Assessment - Text       CM Progress Note Entered On:  07/03/2020 13:18 EDT    Performed On:  07/03/2020 13:17 EDT by Ophelia Shoulder Ann-RN               CM Progress Note   CM Home/Lay Caregiver Name/Relationship :   Spouse:  Inetha Maret    [phone]   CM Initial Tentative Discharge Plan :   Home with services to be determined   CM Progress Note :   Attempted CM assessment 06/05/2020 and 06/06/2020, will reattempt 06/07/2020.  BPaulson RN CM    06/07/2020:  Met with pt/spouse at bedside.  Pt lives with spouse:  Crystalee Ventress [phone].  Independent with ADLS, PTA.  DME:  Shower chair.  STE: 3.  Pt and spouse understand 'plan with Dr Bobette Mo and to be here several weeks for treatment.'  Pt and spouse aware that CM will continue to follow for discharge planning needs.  BPaulson RN    06/11/2020:  Reviewed chart:  Referred pt to [facility] PT, per in-pt PT recomendation.  CM will continue to follow.  BPaulson RN CM    06/13/2020:  Met with pt at bedside.  P.T. has ordered 3-in-1 BSC and WW upon discharge.  CM notified Sherilyn Cooter NP for this request and will continue to follow for discharge planning needs.   BPaulson RN CM    06/15/2020:  Reviewed chart.  Pt ambulating in hallway.  CM will follow up with pt on 06/18/2020 for ordering DME.   BPaulson RN CM    06/18/2020:  Reviewed chart.  Spoke with Sherilyn Cooter RE:  d'c plan with potential DME.  Per oncology:   Pt will be inpt x 10 more days/pt is improving with ambulation/strength - DME order will be considered prior to discharge.  Medicare IM explained, signed and placed in chart.  BPaulson RN CM    06/21/2020:  Reivewed chart.   Pt is febrile, not medically ready for discharge.  Entered in [place] with RN with [name] for discharge planning.  CM continues to follow.  PT will be in [place], followed by Oncology until ~ 06/28/2020/currently followed by ID for fevers.  BPaulson RN CM    06/22/2020:  Reviewed chart.  Pt in [place] thru w/e.  Attempted  to update Medicare IM, pt alseep and unable to reach spouse via tele.  BPaulson RN CM    06/26/2020:  Reviewed chart.  Pt continues treatment with Oncology/ID following.  Medicare IM signed, placed in chart.  There are no dicharge needs identified at this time.  Pt is ambulating and 'does not feel that she needs any HH/DME upon discharge.'  BPaulson RN CM    06/27/2020:  Spouse requests 'telephone number for [facility] 'business office.'  Spouse referred to the following tele number:  [phone].  BPaulson RN CM    07/02/20 TS CM reviewed notes. Pt running fevers, PICC line removed, but cxs good so far. ID will cont dapto and monitor/trend any fevers. CM will cont to follow.      Ophelia Shoulder Ann-RN - 07/03/2020 13:17 EDT

## 2020-07-04 DIAGNOSIS — R600 Localized edema: Secondary | ICD-10-CM | POA: Diagnosis not present

## 2020-07-04 LAB — COMPREHENSIVE METABOLIC PANEL
ALT: 14 U/L (ref 0–33)
AST: 18 U/L (ref 0–32)
Albumin/Globulin Ratio: 1.5 mmol/L (ref 1.00–2.70)
Albumin: 2.8 g/dL — ABNORMAL LOW (ref 3.5–5.2)
Alk Phosphatase: 56 U/L (ref 35–117)
Anion Gap: 6 mmol/L (ref 2–17)
BUN: 9 mg/dL (ref 8–23)
CALCIUM,CORRECTED,CCA: 8.9 mg/dL (ref 8.8–10.2)
CO2: 31 mmol/L — ABNORMAL HIGH (ref 22–29)
Calcium: 7.9 mg/dL — ABNORMAL LOW (ref 8.8–10.2)
Chloride: 98 mmol/L (ref 98–107)
Creatinine: 0.3 mg/dL — ABNORMAL LOW (ref 0.5–1.0)
GFR African American: 131 mL/min/{1.73_m2} (ref >=90)
GFR Non-African American: 113 mL/min/{1.73_m2} (ref >=90)
Globulin: 1.9 g/dL (ref 1.9–4.4)
Glucose: 166 mg/dL — ABNORMAL HIGH (ref 70–99)
OSMOLALITY CALCULATED: 273 mOsm/kg (ref 270–287)
Potassium: 3.2 mmol/L — ABNORMAL LOW (ref 3.5–5.3)
Sodium: 135 mmol/L (ref 135–145)
Total Bilirubin: 0.54 mg/dL (ref 0.00–1.20)
Total Protein: 4.7 g/dL — ABNORMAL LOW (ref 6.4–8.3)

## 2020-07-04 LAB — DIFFERENTIAL, MANUAL
Absolute Lymph #: 0.2 10*3/uL — ABNORMAL LOW (ref 1.0–3.2)
Lymphocytes: 24 % (ref 15–45)
Monocytes: 4 % (ref 4–12)
Neutrophils %. Manual count: 71 % (ref 42–74)
Neutrophils Absolute: 0.6 10*3/uL — ABNORMAL LOW (ref 1.6–7.3)
Platelet Estimate: ADEQUATE
RBC Morphology: ABNORMAL — AB
Reactive Lymphocytes: 1 %

## 2020-07-04 LAB — CBC WITH AUTO DIFFERENTIAL
Hematocrit: 24.1 % — ABNORMAL LOW (ref 34.0–47.0)
Hemoglobin: 8 g/dL — ABNORMAL LOW (ref 11.5–15.7)
MCH: 31.6 pg (ref 27.0–34.5)
MCHC: 33.2 g/dL (ref 32.0–36.0)
MCV: 95.3 fL (ref 81.0–99.0)
MPV: 11.1 fL (ref 7.2–13.2)
NRBC Absolute: 0 10*3/uL (ref 0.000–0.012)
NRBC Automated: 0 % (ref 0.0–0.2)
Platelets: 178 10*3/uL (ref 140–440)
RBC: 2.53 x10e6/mcL — ABNORMAL LOW (ref 3.60–5.20)
RDW: 16.9 % — ABNORMAL HIGH (ref 11.0–16.0)
WBC: 0.9 10*3/uL — CL (ref 3.8–10.6)

## 2020-07-04 LAB — CMV DNA, QUANTITATIVE, PCR: CMV DNA, PCR IU/mL: NEGATIVE IU/mL

## 2020-07-04 LAB — LOW MOLECULAR WGT HEPARIN: Heparin Anti-Xa: 0.39 IU/mL

## 2020-07-04 LAB — PROCALCITONIN: Procalcitonin: 0.11 ng/mL (ref <=0.24)

## 2020-07-04 LAB — PHOSPHORUS: Phosphorus: 3.1 mg/dL (ref 2.5–4.5)

## 2020-07-04 LAB — URIC ACID: Uric Acid: 2.3 mg/dL — ABNORMAL LOW (ref 2.4–5.7)

## 2020-07-04 LAB — LACTATE DEHYDROGENASE: LD: 497 U/L — ABNORMAL HIGH (ref 135–214)

## 2020-07-04 LAB — CULTURE, BLOOD 1

## 2020-07-04 NOTE — Progress Notes (Signed)
 PT Time Spent with Patient Acute/OP-Text       PT Time Spent With Patient Outpatient/Acute Entered On:  07/04/2020 10:36 EDT    Performed On:  07/04/2020 10:35 EDT by CANFIELD, PTA, JEFFREY E               Time Spent With Patient   PT Time In :   10:35 EST   PT Time Out :   10:35 EST   PT Functional Training Time :   0 minutes   PTA Functional Training Units :   0 units   PT Total Timed Code Treatment Units :   0 units   PT Total Timed Code Min :   0    PT Total Treatment Time Acute/OP :   0    CANFIELD, PTA, JEFFREY E - 07/04/2020 10:35 EDT   PT Units Cancelled Missed     PT Units Lost #1          Amount :    2               Reason :    Other: pt sitting up with husband present, nsg in with pt administerinbg meds and starting IV meds as well.  pt requested to circle back around later.  will attempt later if time permits.                CANFIELD, PTA, JEFFREY E - 07/04/2020 10:35 EDT

## 2020-07-04 NOTE — Progress Notes (Signed)
Pharmacy Clinical Interventions - Text       Pharmacy Clinical Interventions Entered On:  07/04/2020 13:23 EDT    Performed On:  07/04/2020 13:23 EDT by Jeanell Sparrow.Altamease Oiler               Interventions   Intervention Type Pharmacy :   Consult-Other   Clinical Importance Pharmacy :   Potentially minor   Medication Safety Reporting Pharmacy :   Non Medication Event   Pharmacist Intervention Time :   16-30 Minutes   [place], [place].Altamease Oiler - 07/04/2020 13:23 EDT

## 2020-07-04 NOTE — Case Communication (Signed)
CM Discharge Planning Assessment - Text       CM Progress Note Entered On:  07/04/2020 14:02 EDT    Performed On:  07/04/2020 13:54 EDT by Robynn Pane M-RN               CM Progress Note   CM Home/Lay Caregiver Name/Relationship :   Spouse:  Lauren Madden    [phone]   CM Initial Tentative Discharge Plan :   Home with services to be determined   CM Progress Note :   Attempted CM assessment 06/05/2020 and 06/06/2020, will reattempt 06/07/2020.  BPaulson RN CM    06/07/2020:  Met with pt/spouse at bedside.  Pt lives with spouse:  Lauren Madden [phone].  Independent with ADLS, PTA.  DME:  Shower chair.  STE: 3.  Pt and spouse understand 'plan with Dr Bobette Mo and to be here several weeks for treatment.'  Pt and spouse aware that CM will continue to follow for discharge planning needs.  BPaulson RN    06/11/2020:  Reviewed chart:  Referred pt to [facility] PT, per in-pt PT recomendation.  CM will continue to follow.  BPaulson RN CM    06/13/2020:  Met with pt at bedside.  P.T. has ordered 3-in-1 BSC and WW upon discharge.  CM notified Sherilyn Cooter NP for this request and will continue to follow for discharge planning needs.   BPaulson RN CM    06/15/2020:  Reviewed chart.  Pt ambulating in hallway.  CM will follow up with pt on 06/18/2020 for ordering DME.   BPaulson RN CM    06/18/2020:  Reviewed chart.  Spoke with Sherilyn Cooter RE:  d'c plan with potential DME.  Per oncology:   Pt will be inpt x 10 more days/pt is improving with ambulation/strength - DME order will be considered prior to discharge.  Medicare IM explained, signed and placed in chart.  BPaulson RN CM    06/21/2020:  Reivewed chart.   Pt is febrile, not medically ready for discharge.  Entered in [place] with RN with [name] for discharge planning.  CM continues to follow.  PT will be in [place], followed by Oncology until ~ 06/28/2020/currently followed by ID for fevers.  BPaulson RN CM    06/22/2020:  Reviewed chart.  Pt in [place] thru w/e.  Attempted to  update Medicare IM, pt alseep and unable to reach spouse via tele.  BPaulson RN CM    06/26/2020:  Reviewed chart.  Pt continues treatment with Oncology/ID following.  Medicare IM signed, placed in chart.  There are no dicharge needs identified at this time.  Pt is ambulating and 'does not feel that she needs any HH/DME upon discharge.'  BPaulson RN CM    06/27/2020:  Spouse requests 'telephone number for [facility] 'business office.'  Spouse referred to the following tele number:  [phone].  BPaulson RN CM    07/02/20 TS CM reviewed notes. Pt running fevers, PICC line removed, but cxs good so far. ID will cont dapto and monitor/trend any fevers. CM will cont to follow.     07/04/2020:  Pt remains in [place], recovering counts/fevers.  CM will continue to follow.  Medicare IM discussed, signed/placed in chart.  BPaulson RN CM     Robynn Pane M-RN - 07/04/2020 13:54 EDT

## 2020-07-05 LAB — ADD ON LAB TEST

## 2020-07-05 LAB — COMPREHENSIVE METABOLIC PANEL
ALT: 12 U/L (ref 0–33)
AST: 15 U/L (ref 0–32)
Albumin/Globulin Ratio: 1.4 mmol/L (ref 1.00–2.70)
Albumin: 2.7 g/dL — ABNORMAL LOW (ref 3.5–5.2)
Alk Phosphatase: 53 U/L (ref 35–117)
Anion Gap: 6 mmol/L (ref 2–17)
BUN: 8 mg/dL (ref 8–23)
CALCIUM,CORRECTED,CCA: 8.6 mg/dL — ABNORMAL LOW (ref 8.8–10.2)
CO2: 28 mmol/L (ref 22–29)
Calcium: 7.6 mg/dL — ABNORMAL LOW (ref 8.8–10.2)
Chloride: 99 mmol/L (ref 98–107)
Creatinine: 0.3 mg/dL — ABNORMAL LOW (ref 0.5–1.0)
GFR African American: 131 mL/min/{1.73_m2} (ref >=90)
GFR Non-African American: 113 mL/min/{1.73_m2} (ref >=90)
Globulin: 2 g/dL (ref 1.9–4.4)
Glucose: 302 mg/dL — ABNORMAL HIGH (ref 70–99)
OSMOLALITY CALCULATED: 276 mOsm/kg (ref 270–287)
Potassium: 3.3 mmol/L — ABNORMAL LOW (ref 3.5–5.3)
Sodium: 133 mmol/L — ABNORMAL LOW (ref 135–145)
Total Bilirubin: 0.59 mg/dL (ref 0.00–1.20)
Total Protein: 4.7 g/dL — ABNORMAL LOW (ref 6.4–8.3)

## 2020-07-05 LAB — MAGNESIUM: Magnesium: 1.9 mg/dL (ref 1.6–2.6)

## 2020-07-05 LAB — CBC WITH AUTO DIFFERENTIAL
Hematocrit: 22.3 % — ABNORMAL LOW (ref 34.0–47.0)
Hemoglobin: 7.3 g/dL — ABNORMAL LOW (ref 11.5–15.7)
MCH: 31.3 pg (ref 27.0–34.5)
MCHC: 32.7 g/dL (ref 32.0–36.0)
MCV: 95.7 fL (ref 81.0–99.0)
MPV: 10.2 fL (ref 7.2–13.2)
NRBC Absolute: 0 10*3/uL (ref 0.000–0.012)
NRBC Automated: 0 % (ref 0.0–0.2)
Platelets: 149 10*3/uL (ref 140–440)
RBC: 2.33 x10e6/mcL — ABNORMAL LOW (ref 3.60–5.20)
RDW: 16.8 % — ABNORMAL HIGH (ref 11.0–16.0)
WBC: 0.7 10*3/uL — CL (ref 3.8–10.6)

## 2020-07-05 LAB — DIFFERENTIAL, MANUAL
Absolute Lymph #: 0.2 10*3/uL — ABNORMAL LOW (ref 1.0–3.2)
Lymphocytes: 29 % (ref 15–45)
Monocytes: 2 % — ABNORMAL LOW (ref 4–12)
Neutrophils %. Manual count: 69 % (ref 42–74)
Neutrophils Absolute: 0.5 10*3/uL — ABNORMAL LOW (ref 1.6–7.3)
Platelet Estimate: ADEQUATE
RBC Morphology: NORMAL

## 2020-07-05 LAB — POTASSIUM: Potassium: 3.9 mmol/L (ref 3.5–5.3)

## 2020-07-05 LAB — LACTATE DEHYDROGENASE: LD: 415 U/L — ABNORMAL HIGH (ref 135–214)

## 2020-07-05 LAB — CULTURE, BLOOD 1

## 2020-07-05 LAB — PHOSPHORUS: Phosphorus: 2.6 mg/dL (ref 2.5–4.5)

## 2020-07-05 LAB — URIC ACID: Uric Acid: 2 mg/dL — ABNORMAL LOW (ref 2.4–5.7)

## 2020-07-06 LAB — CBC WITH AUTO DIFFERENTIAL
Hematocrit: 23.8 % — ABNORMAL LOW (ref 34.0–47.0)
Hemoglobin: 7.8 g/dL — ABNORMAL LOW (ref 11.5–15.7)
MCH: 30.7 pg (ref 27.0–34.5)
MCHC: 32.8 g/dL (ref 32.0–36.0)
MCV: 93.7 fL (ref 81.0–99.0)
MPV: 10.3 fL (ref 7.2–13.2)
NRBC Absolute: 0 10*3/uL (ref 0.000–0.012)
NRBC Automated: 0 % (ref 0.0–0.2)
Platelets: 149 10*3/uL (ref 140–440)
RBC: 2.54 x10e6/mcL — ABNORMAL LOW (ref 3.60–5.20)
RDW: 19.2 % — ABNORMAL HIGH (ref 11.0–16.0)
WBC: 0.7 10*3/uL — CL (ref 3.8–10.6)

## 2020-07-06 LAB — COMPREHENSIVE METABOLIC PANEL
ALT: 15 U/L (ref 0–33)
AST: 17 U/L (ref 0–32)
Albumin/Globulin Ratio: 1.3 mmol/L (ref 1.00–2.70)
Albumin: 2.9 g/dL — ABNORMAL LOW (ref 3.5–5.2)
Alk Phosphatase: 58 U/L (ref 35–117)
Anion Gap: 8 mmol/L (ref 2–17)
BUN: 9 mg/dL (ref 8–23)
CALCIUM,CORRECTED,CCA: 9 mg/dL (ref 8.8–10.2)
CO2: 26 mmol/L (ref 22–29)
Calcium: 8.1 mg/dL — ABNORMAL LOW (ref 8.8–10.2)
Chloride: 103 mmol/L (ref 98–107)
Creatinine: 0.4 mg/dL — ABNORMAL LOW (ref 0.5–1.0)
GFR African American: 119 mL/min/{1.73_m2} (ref >=90)
GFR Non-African American: 103 mL/min/{1.73_m2} (ref >=90)
Globulin: 2.3 g/dL (ref 1.9–4.4)
Glucose: 94 mg/dL (ref 70–99)
OSMOLALITY CALCULATED: 272 mOsm/kg (ref 270–287)
Potassium: 3.6 mmol/L (ref 3.5–5.3)
Sodium: 137 mmol/L (ref 135–145)
Total Bilirubin: 0.61 mg/dL (ref 0.00–1.20)
Total Protein: 5.2 g/dL — ABNORMAL LOW (ref 6.4–8.3)

## 2020-07-06 LAB — DIFFERENTIAL, MANUAL
Absolute Lymph #: 0.2 10*3/uL — ABNORMAL LOW (ref 1.0–3.2)
Lymphocytes: 26 % (ref 15–45)
Neutrophils %. Manual count: 74 % (ref 42–74)
Neutrophils Absolute: 0.5 10*3/uL — ABNORMAL LOW (ref 1.6–7.3)
Platelet Estimate: ADEQUATE
RBC Morphology: ABNORMAL — AB

## 2020-07-06 LAB — OCCULT BLOOD, FECAL
Lot/Kit Number: 51402
Occult Blood Fecal: NEGATIVE

## 2020-07-06 LAB — IMMATURE CELLS
IMMATURE PLT ABSOLUTE: 7.2 10*3/uL
IMMATURE PLT PERCENT: 4.8 % (ref 1.2–8.6)

## 2020-07-06 LAB — ANTIBODY SCREEN: Antibody Screen: NEGATIVE

## 2020-07-06 LAB — URIC ACID: Uric Acid: 1.9 mg/dL — ABNORMAL LOW (ref 2.4–5.7)

## 2020-07-06 LAB — ABO/RH: ABO/Rh: B NEG

## 2020-07-06 LAB — LACTATE DEHYDROGENASE: LD: 392 U/L — ABNORMAL HIGH (ref 135–214)

## 2020-07-06 LAB — PHOSPHORUS: Phosphorus: 3.2 mg/dL (ref 2.5–4.5)

## 2020-07-07 LAB — COMPREHENSIVE METABOLIC PANEL
ALT: 14 U/L (ref 0–33)
AST: 17 U/L (ref 0–32)
Albumin/Globulin Ratio: 1.1 mmol/L (ref 1.00–2.70)
Albumin: 3.1 g/dL — ABNORMAL LOW (ref 3.5–5.2)
Alk Phosphatase: 61 U/L (ref 35–117)
Anion Gap: 9 mmol/L (ref 2–17)
BUN: 10 mg/dL (ref 8–23)
CALCIUM,CORRECTED,CCA: 8.8 mg/dL (ref 8.8–10.2)
CO2: 25 mmol/L (ref 22–29)
Calcium: 8.1 mg/dL — ABNORMAL LOW (ref 8.8–10.2)
Chloride: 101 mmol/L (ref 98–107)
Creatinine: 0.4 mg/dL — ABNORMAL LOW (ref 0.5–1.0)
GFR African American: 119 mL/min/{1.73_m2} (ref >=90)
GFR Non-African American: 103 mL/min/{1.73_m2} (ref >=90)
Globulin: 2.7 g/dL (ref 1.9–4.4)
Glucose: 97 mg/dL (ref 70–99)
OSMOLALITY CALCULATED: 269 mOsm/kg — ABNORMAL LOW (ref 270–287)
Potassium: 3.4 mmol/L — ABNORMAL LOW (ref 3.5–5.3)
Sodium: 135 mmol/L (ref 135–145)
Total Bilirubin: 0.63 mg/dL (ref 0.00–1.20)
Total Protein: 5.8 g/dL — ABNORMAL LOW (ref 6.4–8.3)

## 2020-07-07 LAB — LACTATE DEHYDROGENASE: LD: 370 U/L — ABNORMAL HIGH (ref 135–214)

## 2020-07-07 LAB — CBC WITH AUTO DIFFERENTIAL
Hematocrit: 28.3 % — ABNORMAL LOW (ref 34.0–47.0)
Hemoglobin: 9.3 g/dL — ABNORMAL LOW (ref 11.5–15.7)
MCH: 30.2 pg (ref 27.0–34.5)
MCHC: 32.9 g/dL (ref 32.0–36.0)
MCV: 91.9 fL (ref 81.0–99.0)
MPV: 11.1 fL (ref 7.2–13.2)
NRBC Absolute: 0 10*3/uL (ref 0.000–0.012)
NRBC Automated: 0 % (ref 0.0–0.2)
Platelets: 89 10*3/uL — ABNORMAL LOW (ref 140–440)
RBC: 3.08 x10e6/mcL — ABNORMAL LOW (ref 3.60–5.20)
RDW: 17.8 % — ABNORMAL HIGH (ref 11.0–16.0)
WBC: 0.8 10*3/uL — CL (ref 3.8–10.6)

## 2020-07-07 LAB — DIFFERENTIAL, MANUAL
Absolute Lymph #: 0.2 10*3/uL — ABNORMAL LOW (ref 1.0–3.2)
Bands Relative: 1 % (ref 0–5)
Lymphocytes: 23 % (ref 15–45)
Monocytes: 1 % — ABNORMAL LOW (ref 4–12)
Neutrophils %. Manual count: 75 % — ABNORMAL HIGH (ref 42–74)
Neutrophils Absolute: 0.6 10*3/uL — ABNORMAL LOW (ref 1.6–7.3)
Platelet Estimate: ADEQUATE
RBC Morphology: ABNORMAL — AB

## 2020-07-07 LAB — ASP GALACTOMANNAN AG: Aspergillus Galacto AG: 0.03 Index (ref 0.00–0.49)

## 2020-07-07 LAB — PHOSPHORUS: Phosphorus: 3.6 mg/dL (ref 2.5–4.5)

## 2020-07-07 LAB — FUNGITELL: Fungitell (Beta D-Glucan): 47 pg/mL (ref <80)

## 2020-07-08 LAB — COMPREHENSIVE METABOLIC PANEL
ALT: 13 U/L (ref 0–33)
AST: 15 U/L (ref 0–32)
Albumin/Globulin Ratio: 1.1 mmol/L (ref 1.00–2.70)
Albumin: 3 g/dL — ABNORMAL LOW (ref 3.5–5.2)
Alk Phosphatase: 62 U/L (ref 35–117)
Anion Gap: 9 mmol/L (ref 2–17)
BUN: 9 mg/dL (ref 8–23)
CALCIUM,CORRECTED,CCA: 9.2 mg/dL (ref 8.8–10.2)
CO2: 24 mmol/L (ref 22–29)
Calcium: 8.4 mg/dL — ABNORMAL LOW (ref 8.8–10.2)
Chloride: 106 mmol/L (ref 98–107)
Creatinine: 0.4 mg/dL — ABNORMAL LOW (ref 0.5–1.0)
GFR African American: 119 mL/min/{1.73_m2} (ref >=90)
GFR Non-African American: 103 mL/min/{1.73_m2} (ref >=90)
Globulin: 2.7 g/dL (ref 1.9–4.4)
Glucose: 93 mg/dL (ref 70–99)
OSMOLALITY CALCULATED: 276 mOsm/kg (ref 270–287)
Potassium: 3.6 mmol/L (ref 3.5–5.3)
Sodium: 139 mmol/L (ref 135–145)
Total Bilirubin: 0.49 mg/dL (ref 0.00–1.20)
Total Protein: 5.7 g/dL — ABNORMAL LOW (ref 6.4–8.3)

## 2020-07-08 LAB — CBC WITH AUTO DIFFERENTIAL
Hematocrit: 28 % — ABNORMAL LOW (ref 34.0–47.0)
Hemoglobin: 9.2 g/dL — ABNORMAL LOW (ref 11.5–15.7)
MCH: 30.6 pg (ref 27.0–34.5)
MCHC: 32.9 g/dL (ref 32.0–36.0)
MCV: 93 fL (ref 81.0–99.0)
MPV: 10.9 fL (ref 7.2–13.2)
NRBC Absolute: 0 10*3/uL (ref 0.000–0.012)
NRBC Automated: 0 % (ref 0.0–0.2)
Platelets: 47 10*3/uL — ABNORMAL LOW (ref 140–440)
RBC: 3.01 x10e6/mcL — ABNORMAL LOW (ref 3.60–5.20)
RDW: 17.3 % — ABNORMAL HIGH (ref 11.0–16.0)
WBC: 0.4 10*3/uL — CL (ref 3.8–10.6)

## 2020-07-08 LAB — OCCULT BLOOD, FECAL
Lot/Kit Number: 51402
Lot/Kit Number: 51402
Occult Blood Fecal: NEGATIVE
Occult Blood Fecal: NEGATIVE

## 2020-07-08 LAB — PHOSPHORUS: Phosphorus: 3.5 mg/dL (ref 2.5–4.5)

## 2020-07-08 LAB — PROCALCITONIN: Procalcitonin: 0.1 ng/mL (ref <=0.24)

## 2020-07-08 LAB — DIFFERENTIAL, MANUAL
Absolute Lymph #: 0.2 10*3/uL — ABNORMAL LOW (ref 1.0–3.2)
Basophils %: 1 % (ref 0–2)
Lymphocytes: 47 % — ABNORMAL HIGH (ref 15–45)
Monocytes: 5 % (ref 4–12)
Neutrophils %. Manual count: 47 % (ref 42–74)
Neutrophils Absolute: 0.2 10*3/uL — ABNORMAL LOW (ref 1.6–7.3)
Platelet Estimate: DECREASED — AB
RBC Morphology: ABNORMAL — AB

## 2020-07-08 LAB — LACTATE DEHYDROGENASE: LD: 338 U/L — ABNORMAL HIGH (ref 135–214)

## 2020-07-08 LAB — CULTURE, BLOOD 1

## 2020-07-08 LAB — IMMATURE CELLS
IMMATURE PLT ABSOLUTE: 2.1 10*3/uL
IMMATURE PLT PERCENT: 4.4 % (ref 1.2–8.6)

## 2020-07-08 LAB — CULTURE, URINE: FINAL REPORT: NO GROWTH

## 2020-07-09 LAB — COMPREHENSIVE METABOLIC PANEL
ALT: 14 U/L (ref 0–33)
AST: 14 U/L (ref 0–32)
Albumin/Globulin Ratio: 1.2 mmol/L (ref 1.00–2.70)
Albumin: 3.2 g/dL — ABNORMAL LOW (ref 3.5–5.2)
Alk Phosphatase: 68 U/L (ref 35–117)
Anion Gap: 11 mmol/L (ref 2–17)
BUN: 9 mg/dL (ref 8–23)
CALCIUM,CORRECTED,CCA: 9.4 mg/dL (ref 8.8–10.2)
CO2: 25 mmol/L (ref 22–29)
Calcium: 8.8 mg/dL (ref 8.8–10.2)
Chloride: 103 mmol/L (ref 98–107)
Creatinine: 0.3 mg/dL — ABNORMAL LOW (ref 0.5–1.0)
GFR African American: 131 mL/min/{1.73_m2} (ref >=90)
GFR Non-African American: 113 mL/min/{1.73_m2} (ref >=90)
Globulin: 2.7 g/dL (ref 1.9–4.4)
Glucose: 94 mg/dL (ref 70–99)
OSMOLALITY CALCULATED: 276 mOsm/kg (ref 270–287)
Potassium: 3.8 mmol/L (ref 3.5–5.3)
Sodium: 139 mmol/L (ref 135–145)
Total Bilirubin: 0.5 mg/dL (ref 0.00–1.20)
Total Protein: 5.9 g/dL — ABNORMAL LOW (ref 6.4–8.3)

## 2020-07-09 LAB — CBC WITH AUTO DIFFERENTIAL
Hematocrit: 28.3 % — ABNORMAL LOW (ref 34.0–47.0)
Hemoglobin: 9.3 g/dL — ABNORMAL LOW (ref 11.5–15.7)
MCH: 30 pg (ref 27.0–34.5)
MCHC: 32.9 g/dL (ref 32.0–36.0)
MCV: 91.3 fL (ref 81.0–99.0)
MPV: 10.8 fL (ref 7.2–13.2)
NRBC Absolute: 0 10*3/uL (ref 0.000–0.012)
NRBC Automated: 0 % (ref 0.0–0.2)
Platelets: 46 10*3/uL — ABNORMAL LOW (ref 140–440)
RBC: 3.1 x10e6/mcL — ABNORMAL LOW (ref 3.60–5.20)
RDW: 16.9 % — ABNORMAL HIGH (ref 11.0–16.0)
WBC: 0.3 10*3/uL — CL (ref 3.8–10.6)

## 2020-07-09 LAB — DIFFERENTIAL, MANUAL
Absolute Lymph #: 0.2 10*3/uL — ABNORMAL LOW (ref 1.0–3.2)
Lymphocytes: 71 % — ABNORMAL HIGH (ref 15–45)
Monocytes: 13 % — ABNORMAL HIGH (ref 4–12)
Neutrophils %. Manual count: 16 % — ABNORMAL LOW (ref 42–74)
Platelet Estimate: DECREASED — AB
RBC Morphology: ABNORMAL — AB

## 2020-07-09 LAB — LACTATE DEHYDROGENASE: LD: 351 U/L — ABNORMAL HIGH (ref 135–214)

## 2020-07-09 LAB — IMMATURE CELLS
IMMATURE PLT ABSOLUTE: 1.2 10*3/uL
IMMATURE PLT PERCENT: 2.5 % (ref 1.2–8.6)

## 2020-07-09 LAB — PHOSPHORUS: Phosphorus: 2.9 mg/dL (ref 2.5–4.5)

## 2020-07-09 NOTE — Progress Notes (Signed)
PT Time Spent with Patient Acute/OP-Text       PT Time Spent With Patient Outpatient/Acute Entered On:  07/09/2020 11:03 EDT    Performed On:  07/09/2020 11:02 EDT by Francesca Jewett, PTA, SANDRA L               Time Spent With Patient   PT Time In :   11:02 EST   MILLEN, PTA, SANDRA L - 07/09/2020 11:02 EDT   PT Treatment Time Comment :   pt up in [place] at arrival ambulating with RW and husband, per RN pt up/amb ad lib. Pt has met all P.T. goals d/c per Rosana Hoes and reconsult if necessary     MILLEN, PTA, SANDRA L - 07/09/2020 14:12 EDT     PT Functional Training Time :   0 minutes   PT Total Timed Code Min :   0    PT Total Treatment Time Acute/OP :   0    MILLEN, PTA, SANDRA L - 07/09/2020 11:02 EDT

## 2020-07-10 LAB — DIFFERENTIAL, MANUAL
Absolute Lymph #: 0.2 10*3/uL — ABNORMAL LOW (ref 1.0–3.2)
Eosinophils %: 2 % (ref 0–7)
Lymphocytes: 76 % — ABNORMAL HIGH (ref 15–45)
Monocytes: 13 % — ABNORMAL HIGH (ref 4–12)
Neutrophils %. Manual count: 9 % — ABNORMAL LOW (ref 42–74)
Platelet Estimate: DECREASED — AB
RBC Morphology: ABNORMAL — AB

## 2020-07-10 LAB — COMPREHENSIVE METABOLIC PANEL
ALT: 14 U/L (ref 0–33)
AST: 13 U/L (ref 0–32)
Albumin/Globulin Ratio: 1.3 mmol/L (ref 1.00–2.70)
Albumin: 3.1 g/dL — ABNORMAL LOW (ref 3.5–5.2)
Alk Phosphatase: 71 U/L (ref 35–117)
Anion Gap: 11 mmol/L (ref 2–17)
BUN: 11 mg/dL (ref 8–23)
CALCIUM,CORRECTED,CCA: 8.9 mg/dL (ref 8.8–10.2)
CO2: 24 mmol/L (ref 22–29)
Calcium: 8.2 mg/dL — ABNORMAL LOW (ref 8.8–10.2)
Chloride: 105 mmol/L (ref 98–107)
Creatinine: 0.3 mg/dL — ABNORMAL LOW (ref 0.5–1.0)
GFR African American: 131 mL/min/{1.73_m2} (ref >=90)
GFR Non-African American: 113 mL/min/{1.73_m2} (ref >=90)
Globulin: 2.4 g/dL (ref 1.9–4.4)
Glucose: 91 mg/dL (ref 70–99)
OSMOLALITY CALCULATED: 278 mOsm/kg (ref 270–287)
Potassium: 3.7 mmol/L (ref 3.5–5.3)
Sodium: 140 mmol/L (ref 135–145)
Total Bilirubin: 0.55 mg/dL (ref 0.00–1.20)
Total Protein: 5.5 g/dL — ABNORMAL LOW (ref 6.4–8.3)

## 2020-07-10 LAB — PHOSPHORUS: Phosphorus: 3 mg/dL (ref 2.5–4.5)

## 2020-07-10 LAB — ABO/RH: ABO/Rh: B NEG

## 2020-07-10 LAB — CBC WITH AUTO DIFFERENTIAL
Hematocrit: 24.9 % — ABNORMAL LOW (ref 34.0–47.0)
Hemoglobin: 8 g/dL — ABNORMAL LOW (ref 11.5–15.7)
MCH: 30.3 pg (ref 27.0–34.5)
MCHC: 32.1 g/dL (ref 32.0–36.0)
MCV: 94.3 fL (ref 81.0–99.0)
MPV: 11.1 fL (ref 7.2–13.2)
NRBC Absolute: 0 10*3/uL (ref 0.000–0.012)
NRBC Automated: 0 % (ref 0.0–0.2)
Platelets: 28 10*3/uL — ABNORMAL LOW (ref 140–440)
RBC: 2.64 x10e6/mcL — ABNORMAL LOW (ref 3.60–5.20)
RDW: 16.5 % — ABNORMAL HIGH (ref 11.0–16.0)
WBC: 0.3 10*3/uL — CL (ref 3.8–10.6)

## 2020-07-10 LAB — CULTURE, TISSUE

## 2020-07-10 LAB — LACTATE DEHYDROGENASE: LD: 341 U/L — ABNORMAL HIGH (ref 135–214)

## 2020-07-10 LAB — IMMATURE CELLS
IMMATURE PLT ABSOLUTE: 0.9 10*3/uL
IMMATURE PLT PERCENT: 3.3 % (ref 1.2–8.6)

## 2020-07-10 NOTE — Case Communication (Signed)
CM Discharge Planning Assessment - Text       CM Progress Note Entered On:  07/10/2020 12:58 EDT    Performed On:  07/10/2020 12:57 EDT by Robynn Pane M-RN               CM Progress Note   CM Home/Lay Caregiver Name/Relationship :   Spouse:  Dayannara Pascal    [phone]   CM Initial Tentative Discharge Plan :   Home with services to be determined   CM Progress Note :   Attempted CM assessment 06/05/2020 and 06/06/2020, will reattempt 06/07/2020.  BPaulson RN CM    06/07/2020:  Met with pt/spouse at bedside.  Pt lives with spouse:  Kalifa Cadden [phone].  Independent with ADLS, PTA.  DME:  Shower chair.  STE: 3.  Pt and spouse understand 'plan with Dr Bobette Mo and to be here several weeks for treatment.'  Pt and spouse aware that CM will continue to follow for discharge planning needs.  BPaulson RN    06/11/2020:  Reviewed chart:  Referred pt to [facility] PT, per in-pt PT recomendation.  CM will continue to follow.  BPaulson RN CM    06/13/2020:  Met with pt at bedside.  P.T. has ordered 3-in-1 BSC and WW upon discharge.  CM notified Sherilyn Cooter NP for this request and will continue to follow for discharge planning needs.   BPaulson RN CM    06/15/2020:  Reviewed chart.  Pt ambulating in hallway.  CM will follow up with pt on 06/18/2020 for ordering DME.   BPaulson RN CM    06/18/2020:  Reviewed chart.  Spoke with Sherilyn Cooter RE:  d'c plan with potential DME.  Per oncology:   Pt will be inpt x 10 more days/pt is improving with ambulation/strength - DME order will be considered prior to discharge.  Medicare IM explained, signed and placed in chart.  BPaulson RN CM    06/21/2020:  Reivewed chart.   Pt is febrile, not medically ready for discharge.  Entered in [place] with RN with [name] for discharge planning.  CM continues to follow.  PT will be in [place], followed by Oncology until ~ 06/28/2020/currently followed by ID for fevers.  BPaulson RN CM    06/22/2020:  Reviewed chart.  Pt in [place] thru w/e.  Attempted to  update Medicare IM, pt alseep and unable to reach spouse via tele.  BPaulson RN CM    06/26/2020:  Reviewed chart.  Pt continues treatment with Oncology/ID following.  Medicare IM signed, placed in chart.  There are no dicharge needs identified at this time.  Pt is ambulating and 'does not feel that she needs any HH/DME upon discharge.'  BPaulson RN CM    06/27/2020:  Spouse requests 'telephone number for [facility] 'business office.'  Spouse referred to the following tele number:  [phone].  BPaulson RN CM    07/02/20 TS CM reviewed notes. Pt running fevers, PICC line removed, but cxs good so far. ID will cont dapto and monitor/trend any fevers. CM will cont to follow.     07/04/2020:  Pt remains in [place], recovering counts/fevers.  CM will continue to follow.  Medicare IM discussed, signed/placed in chart.  BPaulson RN CM    07/10/2020:  Chart reviewed/Medicare IM discussed, signed and placed in chart. Pt remains admitted for 'recovery of counts.' Supporative remains at bedside.  BPaulson RN     Robynn Pane M-RN - 07/10/2020 12:57 EDT

## 2020-07-10 NOTE — Progress Notes (Signed)
Chaplaincy Note - Text       Chaplaincy Note Entered On:  07/10/2020 12:42 EDT    Performed On:  07/10/2020 12:40 EDT by Mellody Dance               Chaplaincy Consult   Faith/Denomination :   Jerrye Bushy - 07/10/2020 12:40 EDT   Follow-Up   Chaplaincy Follow-Up Visit Notes :   Chaplain visited with the pt and her husband. Her husband continues to saty with his wife around the clock. They celebrated their 55th wedding anniversary over the weekend. I was a listening presence as they talked about marriage and kids. Pt looks good and says she is feeling stronger every day. I offered blessings for her continued strength and both thanked me for visiting. PC f/u again.     Mellody Dance - 07/10/2020 12:40 EDT

## 2020-07-11 LAB — CBC WITH AUTO DIFFERENTIAL
Hematocrit: 25.9 % — ABNORMAL LOW (ref 34.0–47.0)
Hemoglobin: 8.3 g/dL — ABNORMAL LOW (ref 11.5–15.7)
MCH: 29.9 pg (ref 27.0–34.5)
MCHC: 32 g/dL (ref 32.0–36.0)
MCV: 93.2 fL (ref 81.0–99.0)
MPV: 12 fL (ref 7.2–13.2)
NRBC Absolute: 0 10*3/uL (ref 0.000–0.012)
NRBC Automated: 0 % (ref 0.0–0.2)
Platelets: 33 10*3/uL — ABNORMAL LOW (ref 140–440)
RBC: 2.78 x10e6/mcL — ABNORMAL LOW (ref 3.60–5.20)
RDW: 16.2 % — ABNORMAL HIGH (ref 11.0–16.0)
WBC: 0.3 10*3/uL — CL (ref 3.8–10.6)

## 2020-07-11 LAB — COMPREHENSIVE METABOLIC PANEL
ALT: 15 U/L (ref 0–33)
AST: 15 U/L (ref 0–32)
Albumin/Globulin Ratio: 1.5 mmol/L (ref 1.00–2.70)
Albumin: 3.4 g/dL — ABNORMAL LOW (ref 3.5–5.2)
Alk Phosphatase: 73 U/L (ref 35–117)
Anion Gap: 11 mmol/L (ref 2–17)
BUN: 13 mg/dL (ref 8–23)
CALCIUM,CORRECTED,CCA: 9.1 mg/dL (ref 8.8–10.2)
CO2: 26 mmol/L (ref 22–29)
Calcium: 8.6 mg/dL — ABNORMAL LOW (ref 8.8–10.2)
Chloride: 103 mmol/L (ref 98–107)
Creatinine: 0.3 mg/dL — ABNORMAL LOW (ref 0.5–1.0)
GFR African American: 131 mL/min/{1.73_m2} (ref >=90)
GFR Non-African American: 113 mL/min/{1.73_m2} (ref >=90)
Globulin: 2.3 g/dL (ref 1.9–4.4)
Glucose: 98 mg/dL (ref 70–99)
OSMOLALITY CALCULATED: 279 mOsm/kg (ref 270–287)
Potassium: 3.6 mmol/L (ref 3.5–5.3)
Sodium: 140 mmol/L (ref 135–145)
Total Bilirubin: 0.56 mg/dL (ref 0.00–1.20)
Total Protein: 5.7 g/dL — ABNORMAL LOW (ref 6.4–8.3)

## 2020-07-11 LAB — DIFFERENTIAL, MANUAL
Absolute Lymph #: 0.2 10*3/uL — ABNORMAL LOW (ref 1.0–3.2)
Absolute Mono #: 0.1 10*3/uL — ABNORMAL LOW (ref 0.3–1.0)
Eosinophils %: 1 % (ref 0–7)
Lymphocytes: 75 % — ABNORMAL HIGH (ref 15–45)
Monocytes: 18 % — ABNORMAL HIGH (ref 4–12)
Neutrophils %. Manual count: 4 % — ABNORMAL LOW (ref 42–74)
Platelet Estimate: DECREASED — AB
RBC Morphology: ABNORMAL — AB
Reactive Lymphocytes: 2 %

## 2020-07-11 LAB — OCCULT BLOOD, FECAL
Lot/Kit Number: 51402
Occult Blood Fecal: NEGATIVE

## 2020-07-11 LAB — IMMATURE CELLS
IMMATURE PLT ABSOLUTE: 1.8 10*3/uL
IMMATURE PLT PERCENT: 5.6 % (ref 1.2–8.6)

## 2020-07-11 LAB — CMV DNA, QUANTITATIVE, PCR: CMV DNA, PCR IU/mL: NEGATIVE IU/mL

## 2020-07-11 LAB — PHOSPHORUS: Phosphorus: 3.1 mg/dL (ref 2.5–4.5)

## 2020-07-11 LAB — LACTATE DEHYDROGENASE: LD: 360 U/L — ABNORMAL HIGH (ref 135–214)

## 2020-07-11 NOTE — Case Communication (Signed)
CM Discharge Planning Assessment - Text       CM Progress Note Entered On:  07/11/2020 14:52 EDT    Performed On:  07/11/2020 14:50 EDT by Robynn Pane M-RN               CM Progress Note   CM Home/Lay Caregiver Name/Relationship :   Spouse:  Jamise Pentland    [phone]   CM Initial Tentative Discharge Plan :   Home with services to be determined   CM Progress Note :   Attempted CM assessment 06/05/2020 and 06/06/2020, will reattempt 06/07/2020.  BPaulson RN CM    06/07/2020:  Met with pt/spouse at bedside.  Pt lives with spouse:  Lauren Madden [phone].  Independent with ADLS, PTA.  DME:  Shower chair.  STE: 3.  Pt and spouse understand 'plan with Dr Bobette Mo and to be here several weeks for treatment.'  Pt and spouse aware that CM will continue to follow for discharge planning needs.  BPaulson RN    06/11/2020:  Reviewed chart:  Referred pt to [facility] PT, per in-pt PT recomendation.  CM will continue to follow.  BPaulson RN CM    06/13/2020:  Met with pt at bedside.  P.T. has ordered 3-in-1 BSC and WW upon discharge.  CM notified Sherilyn Cooter NP for this request and will continue to follow for discharge planning needs.   BPaulson RN CM    06/15/2020:  Reviewed chart.  Pt ambulating in hallway.  CM will follow up with pt on 06/18/2020 for ordering DME.   BPaulson RN CM    06/18/2020:  Reviewed chart.  Spoke with Sherilyn Cooter RE:  d'c plan with potential DME.  Per oncology:   Pt will be inpt x 10 more days/pt is improving with ambulation/strength - DME order will be considered prior to discharge.  Medicare IM explained, signed and placed in chart.  BPaulson RN CM    06/21/2020:  Reivewed chart.   Pt is febrile, not medically ready for discharge.  Entered in [place] with RN with [name] for discharge planning.  CM continues to follow.  PT will be in [place], followed by Oncology until ~ 06/28/2020/currently followed by ID for fevers.  BPaulson RN CM    06/22/2020:  Reviewed chart.  Pt in [place] thru w/e.  Attempted to  update Medicare IM, pt alseep and unable to reach spouse via tele.  BPaulson RN CM    06/26/2020:  Reviewed chart.  Pt continues treatment with Oncology/ID following.  Medicare IM signed, placed in chart.  There are no dicharge needs identified at this time.  Pt is ambulating and 'does not feel that she needs any HH/DME upon discharge.'  BPaulson RN CM    06/27/2020:  Spouse requests 'telephone number for [facility] 'business office.'  Spouse referred to the following tele number:  [phone].  BPaulson RN CM    07/02/20 TS CM reviewed notes. Pt running fevers, PICC line removed, but cxs good so far. ID will cont dapto and monitor/trend any fevers. CM will cont to follow.     07/04/2020:  Pt remains in [place], recovering counts/fevers.  CM will continue to follow.  Medicare IM discussed, signed/placed in chart.  BPaulson RN CM    07/10/2020:  Chart reviewed/Medicare IM discussed, signed and placed in chart. Pt remains admitted for 'recovery of counts.' Supporative spouse remains at bedside.  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  Discharge is expected in ~ 2 weeks, with right PICC line in place upon discharge.  Pt referred to [facility] IV infusion in anticipation of discharge planning.  BPaulson RN CM     Robynn Pane M-RN - 07/11/2020 14:50 EDT

## 2020-07-12 LAB — COMPREHENSIVE METABOLIC PANEL
ALT: 16 U/L (ref 0–33)
AST: 16 U/L (ref 0–32)
Albumin/Globulin Ratio: 1.4 mmol/L (ref 1.00–2.70)
Albumin: 3.5 g/dL (ref 3.5–5.2)
Alk Phosphatase: 83 U/L (ref 35–117)
Anion Gap: 9 mmol/L (ref 2–17)
BUN: 12 mg/dL (ref 8–23)
CO2: 27 mmol/L (ref 22–29)
Calcium: 8.6 mg/dL — ABNORMAL LOW (ref 8.8–10.2)
Chloride: 104 mmol/L (ref 98–107)
Creatinine: 0.4 mg/dL — ABNORMAL LOW (ref 0.5–1.0)
GFR African American: 119 mL/min/{1.73_m2} (ref >=90)
GFR Non-African American: 103 mL/min/{1.73_m2} (ref >=90)
Globulin: 2.5 g/dL (ref 1.9–4.4)
Glucose: 98 mg/dL (ref 70–99)
OSMOLALITY CALCULATED: 279 mOsm/kg (ref 270–287)
Potassium: 3.7 mmol/L (ref 3.5–5.3)
Sodium: 140 mmol/L (ref 135–145)
Total Bilirubin: 0.55 mg/dL (ref 0.00–1.20)
Total Protein: 6 g/dL — ABNORMAL LOW (ref 6.4–8.3)

## 2020-07-12 LAB — DIFFERENTIAL, MANUAL
Absolute Lymph #: 0.3 10*3/uL — ABNORMAL LOW (ref 1.0–3.2)
Eosinophils %: 2 % (ref 0–7)
Lymphocytes: 82 % — ABNORMAL HIGH (ref 15–45)
Monocytes: 12 % (ref 4–12)
Neutrophils %. Manual count: 2 % — ABNORMAL LOW (ref 42–74)
Nucleated RBCs: 4 100WBC — ABNORMAL HIGH (ref 0–0)
Platelet Estimate: DECREASED — AB
RBC Morphology: ABNORMAL — AB
Reactive Lymphocytes: 2 %

## 2020-07-12 LAB — CBC WITH AUTO DIFFERENTIAL
Hematocrit: 25.9 % — ABNORMAL LOW (ref 34.0–47.0)
Hemoglobin: 8.4 g/dL — ABNORMAL LOW (ref 11.5–15.7)
MCH: 30 pg (ref 27.0–34.5)
MCHC: 32.4 g/dL (ref 32.0–36.0)
MCV: 92.5 fL (ref 81.0–99.0)
MPV: 10.8 fL (ref 7.2–13.2)
NRBC Absolute: 0.03 10*3/uL — ABNORMAL HIGH (ref 0.000–0.012)
NRBC Automated: 7.3 % — ABNORMAL HIGH (ref 0.0–0.2)
Platelets: 65 10*3/uL — ABNORMAL LOW (ref 140–440)
RBC: 2.8 x10e6/mcL — ABNORMAL LOW (ref 3.60–5.20)
RDW: 16.3 % — ABNORMAL HIGH (ref 11.0–16.0)
WBC: 0.4 10*3/uL — CL (ref 3.8–10.6)

## 2020-07-12 LAB — LACTATE DEHYDROGENASE: LD: 384 U/L — ABNORMAL HIGH (ref 135–214)

## 2020-07-12 LAB — CULTURE, BLOOD 1

## 2020-07-12 LAB — IMMATURE CELLS
IMMATURE PLT ABSOLUTE: 4.9 10*3/uL
IMMATURE PLT PERCENT: 7.5 % (ref 1.2–8.6)

## 2020-07-12 LAB — VORICONAZOLE, HPLC: Voriconazole: <0.3 ug/mL

## 2020-07-12 LAB — PHOSPHORUS: Phosphorus: 3.2 mg/dL (ref 2.5–4.5)

## 2020-07-12 NOTE — Case Communication (Signed)
CM Discharge Planning Assessment - Text       CM Progress Note Entered On:  07/12/2020 13:33 EDT    Performed On:  07/12/2020 13:33 EDT by Robynn Pane M-RN               CM Progress Note   CM Home/Lay Caregiver Name/Relationship :   Spouse:  Lauren Madden    [phone]   CM Initial Tentative Discharge Plan :   Home with services to be determined   CM Progress Note :   Attempted CM assessment 06/05/2020 and 06/06/2020, will reattempt 06/07/2020.  BPaulson RN CM    06/07/2020:  Met with pt/spouse at bedside.  Pt lives with spouse:  Lauren Madden [phone].  Independent with ADLS, PTA.  DME:  Shower chair.  STE: 3.  Pt and spouse understand 'plan with Dr Bobette Mo and to be here several weeks for treatment.'  Pt and spouse aware that CM will continue to follow for discharge planning needs.  BPaulson RN    06/11/2020:  Reviewed chart:  Referred pt to [facility] PT, per in-pt PT recomendation.  CM will continue to follow.  BPaulson RN CM    06/13/2020:  Met with pt at bedside.  P.T. has ordered 3-in-1 BSC and WW upon discharge.  CM notified Sherilyn Cooter NP for this request and will continue to follow for discharge planning needs.   BPaulson RN CM    06/15/2020:  Reviewed chart.  Pt ambulating in hallway.  CM will follow up with pt on 06/18/2020 for ordering DME.   BPaulson RN CM    06/18/2020:  Reviewed chart.  Spoke with Sherilyn Cooter RE:  d'c plan with potential DME.  Per oncology:   Pt will be inpt x 10 more days/pt is improving with ambulation/strength - DME order will be considered prior to discharge.  Medicare IM explained, signed and placed in chart.  BPaulson RN CM    06/21/2020:  Reivewed chart.   Pt is febrile, not medically ready for discharge.  Entered in [place] with RN with [name] for discharge planning.  CM continues to follow.  PT will be in [place], followed by Oncology until ~ 06/28/2020/currently followed by ID for fevers.  BPaulson RN CM    06/22/2020:  Reviewed chart.  Pt in [place] thru w/e.  Attempted to  update Medicare IM, pt alseep and unable to reach spouse via tele.  BPaulson RN CM    06/26/2020:  Reviewed chart.  Pt continues treatment with Oncology/ID following.  Medicare IM signed, placed in chart.  There are no dicharge needs identified at this time.  Pt is ambulating and 'does not feel that she needs any HH/DME upon discharge.'  BPaulson RN CM    06/27/2020:  Spouse requests 'telephone number for [facility] 'business office.'  Spouse referred to the following tele number:  [phone].  BPaulson RN CM    07/02/20 TS CM reviewed notes. Pt running fevers, PICC line removed, but cxs good so far. ID will cont dapto and monitor/trend any fevers. CM will cont to follow.     07/04/2020:  Pt remains in [place], recovering counts/fevers.  CM will continue to follow.  Medicare IM discussed, signed/placed in chart.  BPaulson RN CM    07/10/2020:  Chart reviewed/Medicare IM discussed, signed and placed in chart. Pt remains admitted for 'recovery of counts.' Supporative spouse remains at bedside.  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  Discharge is expected in ~ 2 weeks, with right PICC line in place upon discharge.  Pt referred to [facility] IV infusion in anticipation of discharge planning.  BPaulson RN CM     Robynn Pane M-RN - 07/12/2020 13:33 EDT

## 2020-07-13 LAB — DIFFERENTIAL, MANUAL
Absolute Lymph #: 0.2 10*3/uL — ABNORMAL LOW (ref 1.0–3.2)
Absolute Mono #: 0.1 10*3/uL — ABNORMAL LOW (ref 0.3–1.0)
Bands Relative: 1 % (ref 0–5)
Basophils %: 1 % (ref 0–2)
Blasts Relative: 2 % — ABNORMAL HIGH (ref 0–0)
Lymphocytes: 56 % — ABNORMAL HIGH (ref 15–45)
Monocytes: 32 % — ABNORMAL HIGH (ref 4–12)
Neutrophils %. Manual count: 6 % — ABNORMAL LOW (ref 42–74)
Nucleated RBCs: 2 100WBC — ABNORMAL HIGH (ref 0–0)
Platelet Estimate: DECREASED — AB
RBC Morphology: ABNORMAL — AB
Reactive Lymphocytes: 2 %

## 2020-07-13 LAB — COMPREHENSIVE METABOLIC PANEL
ALT: 16 U/L (ref 0–33)
AST: 16 U/L (ref 0–32)
Albumin/Globulin Ratio: 1.4 mmol/L (ref 1.00–2.70)
Albumin: 3.4 g/dL — ABNORMAL LOW (ref 3.5–5.2)
Alk Phosphatase: 81 U/L (ref 35–117)
Anion Gap: 8 mmol/L (ref 2–17)
BUN: 10 mg/dL (ref 8–23)
CALCIUM,CORRECTED,CCA: 9.2 mg/dL (ref 8.8–10.2)
CO2: 28 mmol/L (ref 22–29)
Calcium: 8.7 mg/dL — ABNORMAL LOW (ref 8.8–10.2)
Chloride: 106 mmol/L (ref 98–107)
Creatinine: 0.4 mg/dL — ABNORMAL LOW (ref 0.5–1.0)
GFR African American: 119 mL/min/{1.73_m2} (ref >=90)
GFR Non-African American: 103 mL/min/{1.73_m2} (ref >=90)
Globulin: 2.4 g/dL (ref 1.9–4.4)
Glucose: 97 mg/dL (ref 70–99)
OSMOLALITY CALCULATED: 282 mOsm/kg (ref 270–287)
Potassium: 3.7 mmol/L (ref 3.5–5.3)
Sodium: 142 mmol/L (ref 135–145)
Total Bilirubin: 0.55 mg/dL (ref 0.00–1.20)
Total Protein: 5.8 g/dL — ABNORMAL LOW (ref 6.4–8.3)

## 2020-07-13 LAB — CBC WITH AUTO DIFFERENTIAL
Hematocrit: 26.4 % — ABNORMAL LOW (ref 34.0–47.0)
Hemoglobin: 8.6 g/dL — ABNORMAL LOW (ref 11.5–15.7)
MCH: 29.9 pg (ref 27.0–34.5)
MCHC: 32.6 g/dL (ref 32.0–36.0)
MCV: 91.7 fL (ref 81.0–99.0)
MPV: 11.5 fL (ref 7.2–13.2)
NRBC Absolute: 0.05 10*3/uL — ABNORMAL HIGH (ref 0.000–0.012)
NRBC Automated: 11.9 % — ABNORMAL HIGH (ref 0.0–0.2)
Platelets: 98 10*3/uL — ABNORMAL LOW (ref 140–440)
RBC: 2.88 x10e6/mcL — ABNORMAL LOW (ref 3.60–5.20)
RDW: 16.4 % — ABNORMAL HIGH (ref 11.0–16.0)
WBC: 0.4 10*3/uL — CL (ref 3.8–10.6)

## 2020-07-13 LAB — IMMATURE CELLS
IMMATURE PLT ABSOLUTE: 7.1 10*3/uL
IMMATURE PLT PERCENT: 7.2 % (ref 1.2–8.6)

## 2020-07-13 LAB — CBC+HEMATOLOGY REVIEW

## 2020-07-13 LAB — PHOSPHORUS: Phosphorus: 3.6 mg/dL (ref 2.5–4.5)

## 2020-07-13 LAB — LACTATE DEHYDROGENASE: LD: 404 U/L — ABNORMAL HIGH (ref 135–214)

## 2020-07-13 NOTE — Case Communication (Signed)
CM Discharge Planning Assessment - Text       CM Progress Note Entered On:  07/13/2020 13:37 EDT    Performed On:  07/13/2020 13:36 EDT by Robynn Pane M-RN               CM Progress Note   CM Home/Lay Caregiver Name/Relationship :   Spouse:  Carlin Mamone    [phone]   CM Initial Tentative Discharge Plan :   Home with services to be determined   CM Progress Note :   Attempted CM assessment 06/05/2020 and 06/06/2020, will reattempt 06/07/2020.  BPaulson RN CM    06/07/2020:  Met with pt/spouse at bedside.  Pt lives with spouse:  Dereon Williamsen [phone].  Independent with ADLS, PTA.  DME:  Shower chair.  STE: 3.  Pt and spouse understand 'plan with Dr Bobette Mo and to be here several weeks for treatment.'  Pt and spouse aware that CM will continue to follow for discharge planning needs.  BPaulson RN    06/11/2020:  Reviewed chart:  Referred pt to [facility] PT, per in-pt PT recomendation.  CM will continue to follow.  BPaulson RN CM    06/13/2020:  Met with pt at bedside.  P.T. has ordered 3-in-1 BSC and WW upon discharge.  CM notified Sherilyn Cooter NP for this request and will continue to follow for discharge planning needs.   BPaulson RN CM    06/15/2020:  Reviewed chart.  Pt ambulating in hallway.  CM will follow up with pt on 06/18/2020 for ordering DME.   BPaulson RN CM    06/18/2020:  Reviewed chart.  Spoke with Sherilyn Cooter RE:  d'c plan with potential DME.  Per oncology:   Pt will be inpt x 10 more days/pt is improving with ambulation/strength - DME order will be considered prior to discharge.  Medicare IM explained, signed and placed in chart.  BPaulson RN CM    06/21/2020:  Reivewed chart.   Pt is febrile, not medically ready for discharge.  Entered in [place] with RN with [name] for discharge planning.  CM continues to follow.  PT will be in [place], followed by Oncology until ~ 06/28/2020/currently followed by ID for fevers.  BPaulson RN CM    06/22/2020:  Reviewed chart.  Pt in [place] thru w/e.  Attempted to  update Medicare IM, pt alseep and unable to reach spouse via tele.  BPaulson RN CM    06/26/2020:  Reviewed chart.  Pt continues treatment with Oncology/ID following.  Medicare IM signed, placed in chart.  There are no dicharge needs identified at this time.  Pt is ambulating and 'does not feel that she needs any HH/DME upon discharge.'  BPaulson RN CM    06/27/2020:  Spouse requests 'telephone number for [facility] 'business office.'  Spouse referred to the following tele number:  [phone].  BPaulson RN CM    07/02/20 TS CM reviewed notes. Pt running fevers, PICC line removed, but cxs good so far. ID will cont dapto and monitor/trend any fevers. CM will cont to follow.     07/04/2020:  Pt remains in [place], recovering counts/fevers.  CM will continue to follow.  Medicare IM discussed, signed/placed in chart.  BPaulson RN CM    07/10/2020:  Chart reviewed/Medicare IM discussed, signed and placed in chart. Pt remains admitted for 'recovery of counts.' Supporative spouse remains at bedside.  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  Discharge is expected in ~ 2 weeks, with right PICC line in place upon discharge.  Pt referred to [facility] infusion in anticipation of discharge planning.  BPaulson RN CM    07/13/2020:  Pt remains in [place] for approx 1-2 more weeks.  Ensocare updated.  Medicare IM updated @ bedside/placed in Chart.  BPaulson RN CM     Robynn Pane M-RN - 07/13/2020 13:36 EDT

## 2020-07-14 LAB — CBC WITH AUTO DIFFERENTIAL
Absolute Baso #: 0 10*3/uL (ref 0.0–0.2)
Absolute Eos #: 0 10*3/uL (ref 0.0–0.5)
Absolute Lymph #: 0.3 10*3/uL — ABNORMAL LOW (ref 1.0–3.2)
Absolute Mono #: 0.2 10*3/uL — ABNORMAL LOW (ref 0.3–1.0)
Basophils %: 1.6 % (ref 0.0–2.0)
Eosinophils %: 1.6 % (ref 0.0–7.0)
Hematocrit: 25.3 % — ABNORMAL LOW (ref 34.0–47.0)
Hemoglobin: 8.1 g/dL — ABNORMAL LOW (ref 11.5–15.7)
Immature Grans (Abs): 0 10*3/uL (ref 0.00–0.06)
Immature Granulocytes: 0 % (ref 0.0–0.6)
Lymphocytes: 48.4 % — ABNORMAL HIGH (ref 15.0–45.0)
MCH: 29.7 pg (ref 27.0–34.5)
MCHC: 32 g/dL (ref 32.0–36.0)
MCV: 92.7 fL (ref 81.0–99.0)
MPV: 11.4 fL (ref 7.2–13.2)
Monocytes: 37.5 % — ABNORMAL HIGH (ref 4.0–12.0)
NRBC Absolute: 0.08 10*3/uL — ABNORMAL HIGH (ref 0.000–0.012)
NRBC Automated: 12.5 % — ABNORMAL HIGH (ref 0.0–0.2)
Neutrophils %: 10.9 % — ABNORMAL LOW (ref 42.0–74.0)
Neutrophils Absolute: 0.1 10*3/uL — ABNORMAL LOW (ref 1.6–7.3)
Platelets: 155 10*3/uL (ref 140–440)
RBC: 2.73 x10e6/mcL — ABNORMAL LOW (ref 3.60–5.20)
RDW: 16.9 % — ABNORMAL HIGH (ref 11.0–16.0)
WBC: 0.6 10*3/uL — CL (ref 3.8–10.6)

## 2020-07-14 LAB — COMPREHENSIVE METABOLIC PANEL
ALT: 15 U/L (ref 0–33)
AST: 14 U/L (ref 0–32)
Albumin/Globulin Ratio: 1.1 mmol/L (ref 1.00–2.70)
Albumin: 3.2 g/dL — ABNORMAL LOW (ref 3.5–5.2)
Alk Phosphatase: 84 U/L (ref 35–117)
Anion Gap: 9 mmol/L (ref 2–17)
BUN: 9 mg/dL (ref 8–23)
CALCIUM,CORRECTED,CCA: 9.5 mg/dL (ref 8.8–10.2)
CO2: 28 mmol/L (ref 22–29)
Calcium: 8.9 mg/dL (ref 8.8–10.2)
Chloride: 103 mmol/L (ref 98–107)
Creatinine: 0.4 mg/dL — ABNORMAL LOW (ref 0.5–1.0)
GFR African American: 119 mL/min/{1.73_m2} (ref >=90)
GFR Non-African American: 103 mL/min/{1.73_m2} (ref >=90)
Globulin: 2.8 g/dL (ref 1.9–4.4)
Glucose: 90 mg/dL (ref 70–99)
OSMOLALITY CALCULATED: 278 mOsm/kg (ref 270–287)
Potassium: 3.5 mmol/L (ref 3.5–5.3)
Sodium: 140 mmol/L (ref 135–145)
Total Bilirubin: 0.53 mg/dL (ref 0.00–1.20)
Total Protein: 6 g/dL — ABNORMAL LOW (ref 6.4–8.3)

## 2020-07-14 LAB — OCCULT BLOOD, FECAL
Lot/Kit Number: 51402
Occult Blood Fecal: NEGATIVE

## 2020-07-14 LAB — FUNGITELL: Fungitell (Beta D-Glucan): <31 pg/mL (ref <80)

## 2020-07-14 LAB — PHOSPHORUS: Phosphorus: 3.5 mg/dL (ref 2.5–4.5)

## 2020-07-14 LAB — LACTATE DEHYDROGENASE: LD: 428 U/L — ABNORMAL HIGH (ref 135–214)

## 2020-07-15 LAB — CBC WITH AUTO DIFFERENTIAL
Absolute Baso #: 0 10*3/uL (ref 0.0–0.2)
Absolute Eos #: 0 10*3/uL (ref 0.0–0.5)
Absolute Lymph #: 0.4 10*3/uL — ABNORMAL LOW (ref 1.0–3.2)
Absolute Mono #: 0.3 10*3/uL (ref 0.3–1.0)
Basophils %: 0 % (ref 0.0–2.0)
Eosinophils %: 0 % (ref 0.0–7.0)
Hematocrit: 26.3 % — ABNORMAL LOW (ref 34.0–47.0)
Hemoglobin: 8.4 g/dL — ABNORMAL LOW (ref 11.5–15.7)
Immature Grans (Abs): 0.01 10*3/uL (ref 0.00–0.06)
Immature Granulocytes: 1.4 % — ABNORMAL HIGH (ref 0.0–0.6)
Lymphocytes: 48.6 % — ABNORMAL HIGH (ref 15.0–45.0)
MCH: 29.6 pg (ref 27.0–34.5)
MCHC: 31.9 g/dL — ABNORMAL LOW (ref 32.0–36.0)
MCV: 92.6 fL (ref 81.0–99.0)
MPV: 11.3 fL (ref 7.2–13.2)
Monocytes: 34.7 % — ABNORMAL HIGH (ref 4.0–12.0)
NRBC Absolute: 0.11 10*3/uL — ABNORMAL HIGH (ref 0.000–0.012)
NRBC Automated: 15.3 % — ABNORMAL HIGH (ref 0.0–0.2)
Neutrophils %: 15.3 % — ABNORMAL LOW (ref 42.0–74.0)
Neutrophils Absolute: 0.1 10*3/uL — ABNORMAL LOW (ref 1.6–7.3)
Platelets: 241 10*3/uL (ref 140–440)
RBC: 2.84 x10e6/mcL — ABNORMAL LOW (ref 3.60–5.20)
RDW: 17.2 % — ABNORMAL HIGH (ref 11.0–16.0)
WBC: 0.7 10*3/uL — CL (ref 3.8–10.6)

## 2020-07-15 LAB — COMPREHENSIVE METABOLIC PANEL
ALT: 15 U/L (ref 0–33)
AST: 16 U/L (ref 0–32)
Albumin/Globulin Ratio: 1 mmol/L (ref 1.00–2.70)
Albumin: 3.2 g/dL — ABNORMAL LOW (ref 3.5–5.2)
Alk Phosphatase: 90 U/L (ref 35–117)
Anion Gap: 10 mmol/L (ref 2–17)
BUN: 9 mg/dL (ref 8–23)
CALCIUM,CORRECTED,CCA: 9.3 mg/dL (ref 8.8–10.2)
CO2: 27 mmol/L (ref 22–29)
Calcium: 8.7 mg/dL — ABNORMAL LOW (ref 8.8–10.2)
Chloride: 104 mmol/L (ref 98–107)
Creatinine: 0.4 mg/dL — ABNORMAL LOW (ref 0.5–1.0)
GFR African American: 119 mL/min/{1.73_m2} (ref >=90)
GFR Non-African American: 103 mL/min/{1.73_m2} (ref >=90)
Globulin: 3.1 g/dL (ref 1.9–4.4)
Glucose: 102 mg/dL — ABNORMAL HIGH (ref 70–99)
OSMOLALITY CALCULATED: 280 mOsm/kg (ref 270–287)
Potassium: 3.4 mmol/L — ABNORMAL LOW (ref 3.5–5.3)
Sodium: 141 mmol/L (ref 135–145)
Total Bilirubin: 0.46 mg/dL (ref 0.00–1.20)
Total Protein: 6.3 g/dL — ABNORMAL LOW (ref 6.4–8.3)

## 2020-07-15 LAB — LACTATE DEHYDROGENASE: LD: 471 U/L — ABNORMAL HIGH (ref 135–214)

## 2020-07-15 LAB — PHOSPHORUS: Phosphorus: 3.3 mg/dL (ref 2.5–4.5)

## 2020-07-16 DIAGNOSIS — M79604 Pain in right leg: Secondary | ICD-10-CM | POA: Diagnosis not present

## 2020-07-16 DIAGNOSIS — M7989 Other specified soft tissue disorders: Secondary | ICD-10-CM | POA: Diagnosis not present

## 2020-07-16 LAB — DIFFERENTIAL, MANUAL
Absolute Lymph #: 0.3 10*3/uL — ABNORMAL LOW (ref 1.0–3.2)
Absolute Mono #: 0.6 10*3/uL (ref 0.3–1.0)
Lymphocytes: 30 % (ref 15–45)
Monocytes: 58 % — ABNORMAL HIGH (ref 4–12)
Neutrophils %. Manual count: 12 % — ABNORMAL LOW (ref 42–74)
Neutrophils Absolute: 0.1 10*3/uL — ABNORMAL LOW (ref 1.6–7.3)
Nucleated RBCs: 8 100WBC — ABNORMAL HIGH (ref 0–0)
Platelet Estimate: ADEQUATE
RBC Morphology: NORMAL

## 2020-07-16 LAB — CBC WITH AUTO DIFFERENTIAL
Hematocrit: 27.3 % — ABNORMAL LOW (ref 34.0–47.0)
Hemoglobin: 8.8 g/dL — ABNORMAL LOW (ref 11.5–15.7)
MCH: 29.6 pg (ref 27.0–34.5)
MCHC: 32.2 g/dL (ref 32.0–36.0)
MCV: 91.9 fL (ref 81.0–99.0)
MPV: 11.3 fL (ref 7.2–13.2)
NRBC Absolute: 0.17 10*3/uL — ABNORMAL HIGH (ref 0.000–0.012)
NRBC Automated: 15.3 % — ABNORMAL HIGH (ref 0.0–0.2)
Platelets: 327 10*3/uL (ref 140–440)
RBC: 2.97 x10e6/mcL — ABNORMAL LOW (ref 3.60–5.20)
RDW: 17.9 % — ABNORMAL HIGH (ref 11.0–16.0)
WBC: 1.1 10*3/uL — CL (ref 3.8–10.6)

## 2020-07-16 LAB — COMPREHENSIVE METABOLIC PANEL
ALT: 11 U/L (ref 0–33)
AST: 17 U/L (ref 0–32)
Albumin/Globulin Ratio: 1.1 mmol/L (ref 1.00–2.70)
Albumin: 3.2 g/dL — ABNORMAL LOW (ref 3.5–5.2)
Alk Phosphatase: 88 U/L (ref 35–117)
Anion Gap: 8 mmol/L (ref 2–17)
BUN: 10 mg/dL (ref 8–23)
CALCIUM,CORRECTED,CCA: 9.6 mg/dL (ref 8.8–10.2)
CO2: 26 mmol/L (ref 22–29)
Calcium: 9 mg/dL (ref 8.8–10.2)
Chloride: 102 mmol/L (ref 98–107)
Creatinine: 0.4 mg/dL — ABNORMAL LOW (ref 0.5–1.0)
GFR African American: 119 mL/min/{1.73_m2} (ref >=90)
GFR Non-African American: 103 mL/min/{1.73_m2} (ref >=90)
Globulin: 3 g/dL (ref 1.9–4.4)
Glucose: 100 mg/dL — ABNORMAL HIGH (ref 70–99)
OSMOLALITY CALCULATED: 271 mOsm/kg (ref 270–287)
Potassium: 3.7 mmol/L (ref 3.5–5.3)
Sodium: 136 mmol/L (ref 135–145)
Total Bilirubin: 0.47 mg/dL (ref 0.00–1.20)
Total Protein: 6.2 g/dL — ABNORMAL LOW (ref 6.4–8.3)

## 2020-07-16 LAB — LACTATE DEHYDROGENASE: LD: 577 U/L — ABNORMAL HIGH (ref 135–214)

## 2020-07-16 LAB — FUNGITELL

## 2020-07-16 LAB — PHOSPHORUS: Phosphorus: 2.9 mg/dL (ref 2.5–4.5)

## 2020-07-16 NOTE — Progress Notes (Signed)
Pharmacy Progress Note    MD aware. Peri-catheter thrombus is seen in the superficial system involving a portion of the Basilic vein.  Continues on Lovenox.  Monitor closely for bleeding.  Platelet protocol in place - SM    3/1 - PLTs @ 30 - transfused yesterday - tm  3/7 Plt's 106...mh  3/11 Pits 241. lp  3/15 Plts 210..mh  3/16 Plts 178  2/17 Plts 149 lp  3/20 plts 47 MD aware hb  3/25 Plts 98,  Pt on Plt protocol.  Lovenox d/ced 3/21.  Currently on Eliquis. lp  3/26 plts 155, Eliquis. JL  3/27 plts normal, JL  Added by [employer] User: [employer] Signed on 07/16/2020 12:42 PM EDT  ________________________________________________  Connye Burkitt,  SYSTEM

## 2020-07-16 NOTE — Case Communication (Signed)
CM Discharge Planning Assessment - Text       CM Progress Note Entered On:  07/16/2020 14:36 EDT    Performed On:  07/16/2020 14:35 EDT by Robynn Pane M-RN               CM Progress Note   CM Home/Lay Caregiver Name/Relationship :   Spouse:  Aleesia Henney    [phone]   CM Initial Tentative Discharge Plan :   Home with services to be determined   CM Progress Note :   Attempted CM assessment 06/05/2020 and 06/06/2020, will reattempt 06/07/2020.  BPaulson RN CM    06/07/2020:  Met with pt/spouse at bedside.  Pt lives with spouse:  Shaindy Reader [phone].  Independent with ADLS, PTA.  DME:  Shower chair.  STE: 3.  Pt and spouse understand 'plan with Dr Bobette Mo and to be here several weeks for treatment.'  Pt and spouse aware that CM will continue to follow for discharge planning needs.  BPaulson RN    06/11/2020:  Reviewed chart:  Referred pt to [facility] PT, per in-pt PT recomendation.  CM will continue to follow.  BPaulson RN CM    06/13/2020:  Met with pt at bedside.  P.T. has ordered 3-in-1 BSC and WW upon discharge.  CM notified Sherilyn Cooter NP for this request and will continue to follow for discharge planning needs.   BPaulson RN CM    06/15/2020:  Reviewed chart.  Pt ambulating in hallway.  CM will follow up with pt on 06/18/2020 for ordering DME.   BPaulson RN CM    06/18/2020:  Reviewed chart.  Spoke with Sherilyn Cooter RE:  d'c plan with potential DME.  Per oncology:   Pt will be inpt x 10 more days/pt is improving with ambulation/strength - DME order will be considered prior to discharge.  Medicare IM explained, signed and placed in chart.  BPaulson RN CM    06/21/2020:  Reivewed chart.   Pt is febrile, not medically ready for discharge.  Entered in [place] with RN with [name] for discharge planning.  CM continues to follow.  PT will be in [place], followed by Oncology until ~ 06/28/2020/currently followed by ID for fevers.  BPaulson RN CM    06/22/2020:  Reviewed chart.  Pt in [place] thru w/e.  Attempted to  update Medicare IM, pt alseep and unable to reach spouse via tele.  BPaulson RN CM    06/26/2020:  Reviewed chart.  Pt continues treatment with Oncology/ID following.  Medicare IM signed, placed in chart.  There are no dicharge needs identified at this time.  Pt is ambulating and 'does not feel that she needs any HH/DME upon discharge.'  BPaulson RN CM    06/27/2020:  Spouse requests 'telephone number for [facility] 'business office.'  Spouse referred to the following tele number:  [phone].  BPaulson RN CM    07/02/20 TS CM reviewed notes. Pt running fevers, PICC line removed, but cxs good so far. ID will cont dapto and monitor/trend any fevers. CM will cont to follow.     07/04/2020:  Pt remains in [place], recovering counts/fevers.  CM will continue to follow.  Medicare IM discussed, signed/placed in chart.  BPaulson RN CM    07/10/2020:  Chart reviewed/Medicare IM discussed, signed and placed in chart. Pt remains admitted for 'recovery of counts.' Supporative spouse remains at bedside.  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  Discharge is expected in ~ 2 weeks, with right PICC line in place upon discharge.  Pt referred to [facility] infusion in anticipation of discharge planning.  BPaulson RN CM    07/13/2020:  Pt remains in [place] for approx 1-2 more weeks.  Ensocare updated.  Medicare IM updated @ bedside/placed in Chart.  BPaulson RN CM    07/16/2020:  Attended r[place] with Dr Bobette Mo.  Pt will remain in [place] x 1-2 more weeks for WBC recovery.  CM spoke to pt RE:  [facility] and [facility] IV infusion services.  Ensocare updated.  BPaulson RN CM     Robynn Pane M-RN - 07/16/2020 14:35 EDT

## 2020-07-17 LAB — CBC WITH AUTO DIFFERENTIAL
Hematocrit: 26.8 % — ABNORMAL LOW (ref 34.0–47.0)
Hemoglobin: 8.5 g/dL — ABNORMAL LOW (ref 11.5–15.7)
MCH: 29.4 pg (ref 27.0–34.5)
MCHC: 31.7 g/dL — ABNORMAL LOW (ref 32.0–36.0)
MCV: 92.7 fL (ref 81.0–99.0)
MPV: 11.3 fL (ref 7.2–13.2)
NRBC Absolute: 0.26 10*3/uL — ABNORMAL HIGH (ref 0.000–0.012)
NRBC Automated: 18.7 % — ABNORMAL HIGH (ref 0.0–0.2)
Platelets: 398 10*3/uL (ref 140–440)
RBC: 2.89 x10e6/mcL — ABNORMAL LOW (ref 3.60–5.20)
RDW: 18.3 % — ABNORMAL HIGH (ref 11.0–16.0)
WBC: 1.4 10*3/uL — CL (ref 3.8–10.6)

## 2020-07-17 LAB — DIFFERENTIAL, MANUAL
Absolute Lymph #: 0.5 10*3/uL — ABNORMAL LOW (ref 1.0–3.2)
Absolute Mono #: 0.5 10*3/uL (ref 0.3–1.0)
Basophils %: 1 % (ref 0–2)
Blasts Absolute: 0.1 10*3/uL
Blasts Relative: 5 % — ABNORMAL HIGH (ref 0–0)
Eosinophils %: 1 % (ref 0–7)
Lymphocytes: 39 % (ref 15–45)
Metamyelocytes Relative: 1 %
Monocytes: 36 % — ABNORMAL HIGH (ref 4–12)
Neutrophils %. Manual count: 13 % — ABNORMAL LOW (ref 42–74)
Neutrophils Absolute: 0.2 10*3/uL — ABNORMAL LOW (ref 1.6–7.3)
Nucleated RBCs: 24 100WBC — ABNORMAL HIGH (ref 0–0)
Platelet Estimate: ADEQUATE
Promyelocytes Percent: 2 %
RBC Morphology: ABNORMAL — AB
Reactive Lymphocytes: 2 %

## 2020-07-17 LAB — OCCULT BLOOD, FECAL
Lot/Kit Number: 51402
Occult Blood Fecal: NEGATIVE

## 2020-07-17 LAB — COMPREHENSIVE METABOLIC PANEL
ALT: 12 U/L (ref 0–33)
AST: 18 U/L (ref 0–32)
Albumin/Globulin Ratio: 1.4 mmol/L (ref 1.00–2.70)
Albumin: 3.5 g/dL (ref 3.5–5.2)
Alk Phosphatase: 95 U/L (ref 35–117)
Anion Gap: 11 mmol/L (ref 2–17)
BUN: 12 mg/dL (ref 8–23)
CO2: 26 mmol/L (ref 22–29)
Calcium: 8.6 mg/dL — ABNORMAL LOW (ref 8.8–10.2)
Chloride: 102 mmol/L (ref 98–107)
Creatinine: 0.4 mg/dL — ABNORMAL LOW (ref 0.5–1.0)
GFR African American: 119 mL/min/{1.73_m2} (ref >=90)
GFR Non-African American: 103 mL/min/{1.73_m2} (ref >=90)
Globulin: 2.5 g/dL (ref 1.9–4.4)
Glucose: 98 mg/dL (ref 70–99)
OSMOLALITY CALCULATED: 275 mOsm/kg (ref 270–287)
Potassium: 4.5 mmol/L (ref 3.5–5.3)
Sodium: 138 mmol/L (ref 135–145)
Total Bilirubin: 0.54 mg/dL (ref 0.00–1.20)
Total Protein: 6 g/dL — ABNORMAL LOW (ref 6.4–8.3)

## 2020-07-17 LAB — ANTIBODY SCREEN: Antibody Screen: NEGATIVE

## 2020-07-17 LAB — LACTATE DEHYDROGENASE: LD: 632 U/L — ABNORMAL HIGH (ref 135–214)

## 2020-07-17 LAB — ABO/RH: ABO/Rh: B NEG

## 2020-07-17 LAB — PHOSPHORUS: Phosphorus: 3.7 mg/dL (ref 2.5–4.5)

## 2020-07-17 NOTE — Case Communication (Signed)
CM Discharge Planning Assessment - Text       CM Progress Note Entered On:  07/17/2020 14:44 EDT    Performed On:  07/17/2020 14:42 EDT by Robynn Pane M-RN               CM Progress Note   CM Home/Lay Caregiver Name/Relationship :   Spouse:  Lauren Madden    [phone]   CM Initial Tentative Discharge Plan :   Home with services to be determined   CM Progress Note :   Attempted CM assessment 06/05/2020 and 06/06/2020, will reattempt 06/07/2020.  BPaulson RN CM    06/07/2020:  Met with pt/spouse at bedside.  Pt lives with spouse:  Lauren Madden [phone].  Independent with ADLS, PTA.  DME:  Shower chair.  STE: 3.  Pt and spouse understand 'plan with Dr Bobette Mo and to be here several weeks for treatment.'  Pt and spouse aware that CM will continue to follow for discharge planning needs.  BPaulson RN    06/11/2020:  Reviewed chart:  Referred pt to [facility] PT, per in-pt PT recomendation.  CM will continue to follow.  BPaulson RN CM    06/13/2020:  Met with pt at bedside.  P.T. has ordered 3-in-1 BSC and WW upon discharge.  CM notified Sherilyn Cooter NP for this request and will continue to follow for discharge planning needs.   BPaulson RN CM    06/15/2020:  Reviewed chart.  Pt ambulating in hallway.  CM will follow up with pt on 06/18/2020 for ordering DME.   BPaulson RN CM    06/18/2020:  Reviewed chart.  Spoke with Sherilyn Cooter RE:  d'c plan with potential DME.  Per oncology:   Pt will be inpt x 10 more days/pt is improving with ambulation/strength - DME order will be considered prior to discharge.  Medicare IM explained, signed and placed in chart.  BPaulson RN CM    06/21/2020:  Reivewed chart.   Pt is febrile, not medically ready for discharge.  Entered in [place] with RN with [name] for discharge planning.  CM continues to follow.  PT will be in [place], followed by Oncology until ~ 06/28/2020/currently followed by ID for fevers.  BPaulson RN CM    06/22/2020:  Reviewed chart.  Pt in [place] thru w/e.  Attempted to  update Medicare IM, pt alseep and unable to reach spouse via tele.  BPaulson RN CM    06/26/2020:  Reviewed chart.  Pt continues treatment with Oncology/ID following.  Medicare IM signed, placed in chart.  There are no dicharge needs identified at this time.  Pt is ambulating and 'does not feel that she needs any HH/DME upon discharge.'  BPaulson RN CM    06/27/2020:  Spouse requests 'telephone number for [facility] 'business office.'  Spouse referred to the following tele number:  [phone].  BPaulson RN CM    07/02/20 TS CM reviewed notes. Pt running fevers, PICC line removed, but cxs good so far. ID will cont dapto and monitor/trend any fevers. CM will cont to follow.     07/04/2020:  Pt remains in [place], recovering counts/fevers.  CM will continue to follow.  Medicare IM discussed, signed/placed in chart.  BPaulson RN CM    07/10/2020:  Chart reviewed/Medicare IM discussed, signed and placed in chart. Pt remains admitted for 'recovery of counts.' Supporative spouse remains at bedside.  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  Discharge is expected in ~ 2 weeks, with right PICC line in place upon discharge.  Pt referred to [facility] infusion in anticipation of discharge planning.  BPaulson RN CM    07/13/2020:  Pt remains in [place] for approx 1-2 more weeks.  Ensocare updated.  Medicare IM updated @ bedside/placed in Chart.  BPaulson RN CM    07/16/2020:  Attended rounds with Dr Bobette Mo.  Pt will remain in [place] x 1-2 more weeks for WBC recovery.  CM spoke to pt RE:  [facility] and [facility] infusion services.  Ensocare updated.  BPaulson RN CM    07/17/2020:  Per Dr Bobette Mo, pt is for discharge to homehospice on 07/18/2020.  PT/spouse choose [facility].  Referral in [facility] @ 1130, pt met at bedside by Gillermo Murdoch, Clinical [place] at 2:00 pm today, CM will continue to follow for d/c planning to home.  BPaulson RN CM Sherilyn Cooter, NP aware of [facility] meeting with pt/spouse at bedside this afternoon.)     Robynn Pane M-RN - 07/17/2020 14:42 EDT

## 2020-07-17 NOTE — Discharge Summary (Signed)
Inpatient Clinical Summary             [facility]  Post-Acute Care Transfer Instructions  PERSON INFORMATION   Name: Lauren Madden, Lauren Madden   MRN: [id number]    FIN#: [id number]   PHYSICIANS  Admitting Physician: Evalyn Casco FREDERICK-MD  Attending Physician: Evalyn Casco FREDERICK-MD   PCP: Anselm Lis  Discharge Diagnosis: 1:Acute leukemia; 2:Polycythemia vera; 3:Immunocompromised patient; 4:Pancytopenia due to disease and treatment; 5:Febrile neutropenia; 6:Toxic drug rash; 7:Cytomegalovirus (CMV) viremia; 8:Hypervolemia; 9:Dyspnea; 10:Anasarca; 11:Diuretic-induced hypokalemia; 12:Acute PICC-associated superficial venous thrombosis of left upper extremity; 13:Acute non-occlusive DVT of axillary vein; 14:History of pulmonary embolus; 15:Hypercoagulable state; 16:Inadequate oral nutritional intake; 18:Generalized muscle weakness; 19:Chronic back pain greater than 3 months duration; 20:History of LEFT breast cancer  Comment:       PATIENT EDUCATION INFORMATION  Instructions:               Medication Leaflets:               Follow-up:                           With: Address: When:   Home care per [facility]                               MEDICATION LIST  Medication Reconciliation at Discharge:          New Medications  Other Medications  acyclovir (acyclovir 400 mg oral tablet) 1 Tabs Oral (given by mouth) 2 times a day.  Last Dose:____________________  Medications That Have Not Changed  Other Medications  albuterol 4 Milligram Nebulized inhalation (inhale using nebulizer) every 6 hours as needed shortness of breath/wheezing.  Last Dose:____________________  ascorbic acid (Ester-C 1000 mg oral tablet) 1 Tabs Oral (given by mouth) every day.  Last Dose:____________________  cyanocobalamin (Vitamin B-12 (cyanocobalamin) 1000 mcg/mL injectable solution) 1 Milliliter Intramuscular (in a muscle) once a month.  Last Dose:____________________  dexlansoprazole (Dexilant 60 mg oral delayed release  capsule) 1 Capsules Oral (given by mouth) every day.  Last Dose:____________________  ergocalciferol (ergocalciferol 50,000 intl units (1.25 mg) oral capsule) 1 Capsules Oral (given by mouth) every week.  Last Dose:____________________  phenyTOIN (Dilantin 100 mg oral capsule, extended release) 1 Capsules Oral (given by mouth) every day.  Last Dose:____________________  phenyTOIN (Dilantin 100 mg oral capsule, extended release) 2 Capsules Oral (given by mouth) Once a Day (at bedtime).  Last Dose:____________________  solifenacin (solifenacin 5 mg oral tablet) 1 Tabs Oral (given by mouth) every day.  Last Dose:____________________         Patient???s Final Home Medication List Upon Discharge:           acyclovir (acyclovir 400 mg oral tablet) 1 Tabs Oral (given by mouth) 2 times a day.  albuterol 4 Milligram Nebulized inhalation (inhale using nebulizer) every 6 hours as needed shortness of breath/wheezing.  ascorbic acid (Ester-C 1000 mg oral tablet) 1 Tabs Oral (given by mouth) every day.  cyanocobalamin (Vitamin B-12 (cyanocobalamin) 1000 mcg/mL injectable solution) 1 Milliliter Intramuscular (in a muscle) once a month.  dexlansoprazole (Dexilant 60 mg oral delayed release capsule) 1 Capsules Oral (given by mouth) every day.  ergocalciferol (ergocalciferol 50,000 intl units (1.25 mg) oral capsule) 1 Capsules Oral (given by mouth) every week.  phenyTOIN (Dilantin 100 mg oral capsule, extended release) 1 Capsules Oral (given by mouth) every day.  phenyTOIN (Dilantin 100 mg oral capsule, extended release) 2 Capsules Oral (given by mouth) Once a Day (at bedtime).  solifenacin (solifenacin 5 mg oral tablet) 1 Tabs Oral (given by mouth) every day.         Comment:       ORDERS          Order Name Order Details

## 2020-07-17 NOTE — Nursing Note (Signed)
Nursing Discharge Summary - Text       Physician Discharge Summary Entered On:  07/17/2020 19:39 EDT    Performed On:  07/17/2020 19:38 EDT by Liz Malady               DC Information   Provider Instructions for Diet :   As Tolerated   Provider Supplement Instructions :   Ensure Enlive or equivalent   Provider Instructions for Activity :   As Tolerated, Daily walk, May shower, Walk with help if needed   Rene Kocher LYNN-NP - 07/17/2020 19:38 EDT

## 2020-07-17 NOTE — Discharge Summary (Signed)
Inpatient Patient Summary              [facility] Emergency Department  [address] [place] [address]  [phone]  Discharge Instructions (Patient)  Name: Lauren Madden, Lauren Madden  DOB:  [birth year]                   MRN: [id number]                   FIN: [id number]  Reason For Visit: Decreased mobility; POLYCYTHEMIA VERA, LEUKEMIA  Final Diagnosis: 1:Acute leukemia; 2:Polycythemia vera; 3:Immunocompromised patient; 4:Pancytopenia due to disease and treatment; 5:Febrile neutropenia; 6:Toxic drug rash; 7:Cytomegalovirus (CMV) viremia; 8:Hypervolemia; 9:Dyspnea; 10:Anasarca; 11:Diuretic-induced hypokalemia; 12:Acute PICC-associated superficial venous thrombosis of left upper extremity; 13:Acute non-occlusive DVT of axillary vein; 14:History of pulmonary embolus; 15:Hypercoagulable state; 16:Inadequate oral nutritional intake; 18:Generalized muscle weakness; 19:Chronic back pain greater than 3 months duration; 20:History of LEFT breast cancer     Visit Date: 06/04/2020 14:55:46  Address: [address] [place] [address]  Phone: [phone]     Emergency Department Providers:         Primary Physician:      None         [facility] would like to thank you for allowing [place] to assist you with your healthcare needs. The following includes patient education materials and information regarding your injury/illness.     Follow-up Instructions:  You were seen today on an emergency basis. Please contact your primary care doctor for a follow up appointment. If you received a referral to a specialist doctor, it is important you follow-up as instructed.    It is important that you call your follow-up doctor to schedule and confirm the location of your next appointment. Your doctor may practice at multiple locations. The office location of your follow-up appointment may be different to the one written on your discharge instructions.    If you do not have a primary care doctor, please call [phone] for  help in finding a [facility] Provider. For help in finding a specialist doctor, please call [phone].    If your condition gets worse before your follow-up with your primary care doctor or specialist, please return to the Emergency Department.      Coronavirus 2019 (COVID-19) Reminders:     Patients age 78 - 48, with parental consent, and patients over age 68 can make an appointment for a COVID-19 vaccine. Patients can contact their [facility] doctors' offices to schedule an appointment to receive the COVID-19 vaccine. Patients who do not have a [facility] physician can call [phone] to schedule vaccination appointments.      Follow Up Appointments:  Primary Care Provider:     Name: Anselm Lis     Phone: [phone]                 With: Address: When:   Home care per [facility] SERVICES%>  Discharge Service Agency Selected  DISCHARGE SERVICE AGENCY SELECTED%>     DME Agency  DME AGENCY%>             New Medications  Other Medications  acyclovir (acyclovir 400 mg oral tablet) 1 Tabs Oral (given by mouth) 2 times a day.  Last Dose:____________________  Medications That Have Not Changed  Other Medications  albuterol 4 Milligram Nebulized inhalation (inhale using nebulizer) every 6 hours as needed shortness of breath/wheezing.  Last Dose:____________________  ascorbic acid (Ester-C 1000 mg oral tablet) 1 Tabs Oral (given by mouth) every day.  Last Dose:____________________  cyanocobalamin (Vitamin B-12 (cyanocobalamin) 1000 mcg/mL injectable solution) 1 Milliliter Intramuscular (in a muscle) once a month.  Last Dose:____________________  dexlansoprazole (Dexilant 60 mg oral delayed release capsule) 1 Capsules Oral (given by mouth) every day.  Last Dose:____________________  ergocalciferol (ergocalciferol 50,000 intl units (1.25 mg) oral capsule) 1 Capsules Oral (given by mouth) every  week.  Last Dose:____________________  phenyTOIN (Dilantin 100 mg oral capsule, extended release) 1 Capsules Oral (given by mouth) every day.  Last Dose:____________________  phenyTOIN (Dilantin 100 mg oral capsule, extended release) 2 Capsules Oral (given by mouth) Once a Day (at bedtime).  Last Dose:____________________  solifenacin (solifenacin 5 mg oral tablet) 1 Tabs Oral (given by mouth) every day.  Last Dose:____________________      Allergy Info: Betadine; Seafood     Discharge Additional Information    Patient Education Materials:       ---------------------------------------------------------------------------------------------------------------------  [facility] allows patients to review your COVID and other test results as well as discharge documents from any [facility], Emergency Department, surgical center or outpatient lab. Test results are typically available 36 hours after the test is completed.     [facility] encourages you to self-enroll in the [facility] Patient Portal.     To begin your self-enrollment process, please visit [email or web address] Under [facility], click on ???Sign up now???.     NOTE: You must be 16 years and older to use [facility] Self-Enroll online. If you are a parent, caregiver, or guardian; you need an invite to access your child???s or dependent???s health records. To obtain an invite, contact the Medical Records department at [phone] Monday through Friday, 8-4:30, select option 3 . If we receive your call afterhours, we will return your call the next business day.     If you have issues trying to create or access your account, contact [employer] support at [phone] available 7 days a week 24 hours a day.     Comment:

## 2020-07-18 LAB — COMPREHENSIVE METABOLIC PANEL
ALT: 12 U/L (ref 0–33)
AST: 19 U/L (ref 0–32)
Albumin/Globulin Ratio: 1.3 mmol/L (ref 1.00–2.70)
Albumin: 3.5 g/dL (ref 3.5–5.2)
Alk Phosphatase: 95 U/L (ref 35–117)
Anion Gap: 8 mmol/L (ref 2–17)
BUN: 12 mg/dL (ref 8–23)
CO2: 28 mmol/L (ref 22–29)
Calcium: 9.1 mg/dL (ref 8.8–10.2)
Chloride: 106 mmol/L (ref 98–107)
Creatinine: 0.5 mg/dL (ref 0.5–1.0)
GFR African American: 110 mL/min/{1.73_m2} (ref >=90)
GFR Non-African American: 95 mL/min/{1.73_m2} (ref >=90)
Globulin: 2.6 g/dL (ref 1.9–4.4)
Glucose: 102 mg/dL — ABNORMAL HIGH (ref 70–99)
OSMOLALITY CALCULATED: 283 mOsm/kg (ref 270–287)
Potassium: 4.4 mmol/L (ref 3.5–5.3)
Sodium: 142 mmol/L (ref 135–145)
Total Bilirubin: 0.55 mg/dL (ref 0.00–1.20)
Total Protein: 6.1 g/dL — ABNORMAL LOW (ref 6.4–8.3)

## 2020-07-18 LAB — DIFFERENTIAL, MANUAL
Absolute Lymph #: 0.3 10*3/uL — ABNORMAL LOW (ref 1.0–3.2)
Absolute Mono #: 0.9 10*3/uL (ref 0.3–1.0)
Bands Relative: 3 % (ref 0–5)
Basophils %: 1 % (ref 0–2)
Blasts Absolute: 0.1 10*3/uL
Blasts Relative: 4 % — ABNORMAL HIGH (ref 0–0)
Eosinophils %: 1 % (ref 0–7)
Lymphocytes: 18 % (ref 15–45)
Metamyelocytes Relative: 1 %
Monocytes: 55 % — ABNORMAL HIGH (ref 4–12)
Myelocyte Percent: 1 %
Neutrophils %. Manual count: 16 % — ABNORMAL LOW (ref 42–74)
Neutrophils Absolute: 0.3 10*3/uL — ABNORMAL LOW (ref 1.6–7.3)
Nucleated RBCs: 11 100WBC — ABNORMAL HIGH (ref 0–0)
Platelet Estimate: ADEQUATE
RBC Morphology: ABNORMAL — AB

## 2020-07-18 LAB — CBC WITH AUTO DIFFERENTIAL
Hematocrit: 30 % — ABNORMAL LOW (ref 34.0–47.0)
Hemoglobin: 9.9 g/dL — ABNORMAL LOW (ref 11.5–15.7)
MCH: 29.9 pg (ref 27.0–34.5)
MCHC: 33 g/dL (ref 32.0–36.0)
MCV: 90.6 fL (ref 81.0–99.0)
MPV: 11.2 fL (ref 7.2–13.2)
NRBC Absolute: 0.19 10*3/uL — ABNORMAL HIGH (ref 0.000–0.012)
NRBC Automated: 12.2 % — ABNORMAL HIGH (ref 0.0–0.2)
Platelets: 408 10*3/uL (ref 140–440)
RBC: 3.31 x10e6/mcL — ABNORMAL LOW (ref 3.60–5.20)
RDW: 18.4 % — ABNORMAL HIGH (ref 11.0–16.0)
WBC: 1.6 10*3/uL — CL (ref 3.8–10.6)

## 2020-07-18 LAB — CMV DNA, QUANTITATIVE, PCR: CMV DNA, PCR IU/mL: NEGATIVE IU/mL

## 2020-07-18 LAB — PHOSPHORUS: Phosphorus: 4.1 mg/dL (ref 2.5–4.5)

## 2020-07-18 LAB — LACTATE DEHYDROGENASE: LD: 662 U/L — ABNORMAL HIGH (ref 135–214)

## 2020-07-18 NOTE — Nursing Note (Signed)
Nursing Discharge Summary - Text       Nursing Discharge Summary Entered On:  07/18/2020 13:42 EDT    Performed On:  07/18/2020 13:39 EDT by Oletta Lamas M-RN               DC Information   Discharge To :   Home independently, Family support, Home hospice, Other: [facility]   Mode of Discharge :   Wheelchair   Transportation :   Private vehicle   Accompanied By :   Leland Her M-RN - 07/18/2020 13:39 EDT   Education   Responsible Learner(s) :   Home Caregiver Name/Relationship: Weyman Croon        Performed by: Dwaine Gale - 06/15/2020 21:15  Home Caregiver Phone Number: [phone]        Performed by: Cassell Clement RN, JESSICA - 06/04/2020 15:27     Home Caregiver Present for Session :   Yes   Barriers To Learning :   None evident   Teaching Method :   Demonstration, Explanation, Printed materials   Oletta Lamas M-RN - 07/18/2020 13:39 EDT   Post-Hospital Education Adult Grid   Activity Expectations :   Trenton Gammon understanding   Diagnostic Results :   Verbalizes understanding   Disease Process :   Verbalizes understanding   Equipment/Devices :   TEFL teacher understanding   Invasive Line Care :   Verbalizes understanding   Pain Management :   Verbalizes understanding   Plan of Care :   Verbalizes understanding   Oletta Lamas M-RN - 07/18/2020 13:39 EDT   Health Maintenance Education Adult Grid   Bathing/Hygiene :   TEFL teacher understanding   Diet/Nutrition :   TEFL teacher understanding   Exercise :   TEFL teacher understanding   Oral Care :   Verbalizes understanding   Oletta Lamas M-RN - 07/18/2020 13:39 EDT   Medication Education Adult Grid   Drug to Drug Interactions :   Verbalizes understanding   Med Dosage, Route, Scheduling :   TEFL teacher understanding   Med Generic/Brand Name, Purpose, Action :   Verbalizes understanding   Medication Precautions :   Verbalizes understanding   Oletta Lamas M-RN - 07/18/2020 13:39 EDT   Safety Education Adult Grid   Safety, Fall :   Verbalizes  understanding   Oletta Lamas M-RN - 07/18/2020 13:39 EDT   Additional Learner(s) Present :   Spouse   Time Spent Educating Patient :   20 minutes   Oletta Lamas M-RN - 07/18/2020 13:39 EDT

## 2020-07-18 NOTE — Case Communication (Signed)
CM Discharge Planning Assessment - Text       CM Discharge Plan Entered On:  07/18/2020 8:18 EDT    Performed On:  07/18/2020 8:15 EDT by Robynn Pane M-RN               CM Discharge Plan   CM Medicare Patient :   Yes   CM 3 MN Stay Validated :   Yes   CM StCM Statusatus :   IP   CM Date/Time Discharge IM :   07/18/2020 11:00 EDT   Discharge Planning Assessment Complete :   Yes   Robynn Pane M-RN - 07/18/2020 10:59 EDT   Discharge To :   Other: [place] [facility] :   Other: [place] [facility] Selected :   [employer]   CM Choice Reason :   Patient preference   CM Family/POA/Guardian Notified :   Yes   CM Person Notified of Discharge :   Lauren Madden   [facility] Home/Lay Caregiver Name/Relationship :   SpouseReverie Madden    [phone]   [facility] Home/Lay Caregiver Contact Number :   Lauren Madden   [phone]   [place] :   [facility] will provide care in your home.  [facility] Telephone number:  [phone].  [facility] RN will meet you in your home on 07/18/2020 to begin home hospice care.     Robynn Pane M-RN - 07/18/2020 8:15 EDT   CM Progress Note :   Attempted CM assessment 06/05/2020 and 06/06/2020, will reattempt 06/07/2020.  BPaulson RN CM    06/07/2020:  Met with pt/spouse at bedside.  Pt lives with spouse:  Lauren Madden [phone].  Independent with ADLS, PTA.  DME:  Shower chair.  STE: 3.  Pt and spouse understand 'plan with Dr Bobette Mo and to be here several weeks for treatment.'  Pt and spouse aware that CM will continue to follow for discharge planning needs.  BPaulson RN    06/11/2020:  Reviewed chart:  Referred pt to [facility] PT, per in-pt PT recomendation.  CM will continue to follow.  BPaulson RN CM    06/13/2020:  Met with pt at bedside.  P.T. has ordered 3-in-1 BSC and WW upon discharge.  CM notified Sherilyn Cooter NP for this request and will continue to follow for discharge planning needs.   BPaulson RN CM    06/15/2020:   Reviewed chart.  Pt ambulating in hallway.  CM will follow up with pt on 06/18/2020 for ordering DME.   BPaulson RN CM    06/18/2020:  Reviewed chart.  Spoke with Sherilyn Cooter RE:  d'c plan with potential DME.  Per oncology:   Pt will be inpt x 10 more days/pt is improving with ambulation/strength - DME order will be considered prior to discharge.  Medicare IM explained, signed and placed in chart.  BPaulson RN CM    06/21/2020:  Reivewed chart.   Pt is febrile, not medically ready for discharge.  Entered in [place] with RN with [name] for discharge planning.  CM continues to follow.  PT will be in [place], followed by Oncology until ~ 06/28/2020/currently followed by ID for fevers.  BPaulson RN CM    06/22/2020:  Reviewed chart.  Pt in [place] thru w/e.  Attempted to update Medicare IM, pt alseep and unable to reach spouse via tele.  BPaulson RN CM    06/26/2020:  Reviewed chart.  Pt continues treatment with Oncology/ID following.  Medicare IM signed, placed in chart.  There are no dicharge needs identified at this time.  Pt is ambulating and 'does not feel that she needs any HH/DME upon discharge.'  BPaulson RN CM    06/27/2020:  Spouse requests 'telephone number for [facility] 'business office.'  Spouse referred to the following tele number:  [phone].  BPaulson RN CM    07/02/20 TS CM reviewed notes. Pt running fevers, PICC line removed, but cxs good so far. ID will cont dapto and monitor/trend any fevers. CM will cont to follow.     07/04/2020:  Pt remains in [place], recovering counts/fevers.  CM will continue to follow.  Medicare IM discussed, signed/placed in chart.  BPaulson RN CM    07/10/2020:  Chart reviewed/Medicare IM discussed, signed and placed in chart. Pt remains admitted for 'recovery of counts.' Supporative spouse remains at bedside.  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  Discharge is expected in ~ 2 weeks, with right PICC line in  place upon discharge.  Pt referred to [facility] IV infusion in anticipation of discharge planning.  BPaulson RN CM    07/13/2020:  Pt remains in [place] for approx 1-2 more weeks.  [employer] updated.  Medicare IM updated @ bedside/placed in Chart.  BPaulson RN CM    07/16/2020:  Attended rounds with Dr Bobette Mo.  Pt will remain in [place] x 1-2 more weeks for WBC recovery.  CM spoke to pt RE:  [facility] IV infusion services.  [employer] updated.  BPaulson RN CM    07/17/2020:  Per Dr Bobette Mo, pt is for discharge to homehospice on 07/18/2020.  PT/spouse choose [facility].  Referral in [facility] @ 1130, pt met at bedside by Gillermo Murdoch, [facility] at 2:00 pm today, CM will continue to follow for d/c planning to home.  BPaulson RN CM Sherilyn Cooter, NP aware of [facility] meeting with pt/spouse at bedside this afternoon.)    07/18/2020:  Pt for discharge today.  [place] aware, will meet pt at the home in [facility] for home hospice admission.  Staff RN aware of d/c plan of care.  Pt/spouse agree to discharge Medicare IM/discussed at bedside/verbally signed/placed in chart.  BPaulson RN CM     Robynn Pane M-RN - 07/18/2020 10:59 EDT

## 2020-08-02 LAB — CULTURE, FUNGUS: KOH Prep: NONE SEEN

## 2020-08-17 LAB — CULTURE WITH SMEAR, ACID FAST BACILLIUS, NON-BLOOD: Acid Fast Smear: NONE SEEN

## 2021-12-17 IMAGING — CT CT CHEST-ABD-PELV W/ CM
3 of 5 series · 14 of 36 positions shown, 16 images · IV contrast (Omnipaque)
Comparison: CT 09/03/2017

CLINICAL DATA: Abnormal weight loss.  RIGHT-sided breast cancer.

EXAM:
CT CHEST, ABDOMEN, AND PELVIS WITH CONTRAST
TECHNIQUE: Multidetector CT imaging of the chest, abdomen and pelvis was
performed following the standard protocol during bolus
administration of intravenous contrast.
CONTRAST:  100mL OMNIPAQUE IOHEXOL 300 MG/ML  SOLN

[Series 2: cap with 2 · axial · 0.73mm/px · z∈[-626,-121]mm · 10 of 125 slices shown, 12 images]
[im 12/125  mediastinal]
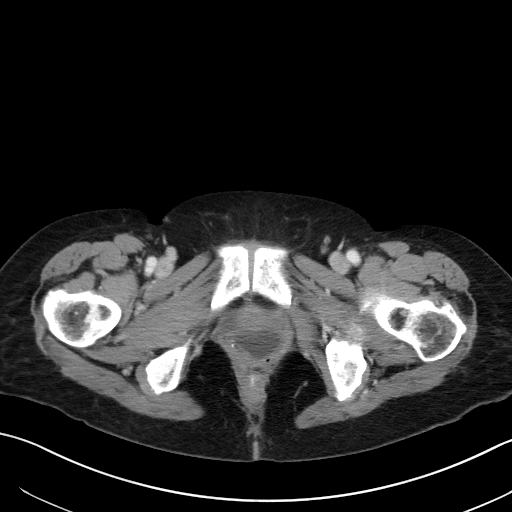
[im 12/125  bone]
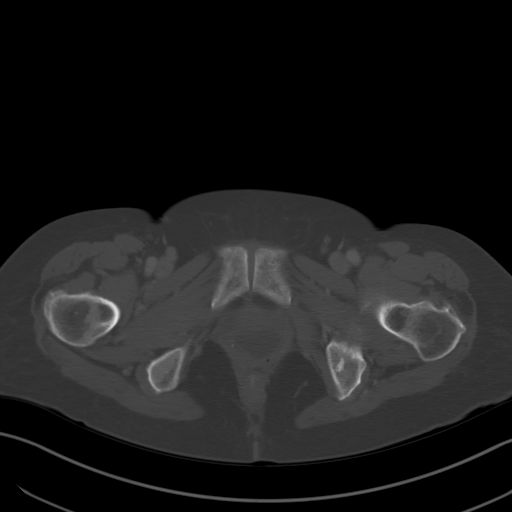
[im 23/125  mediastinal]
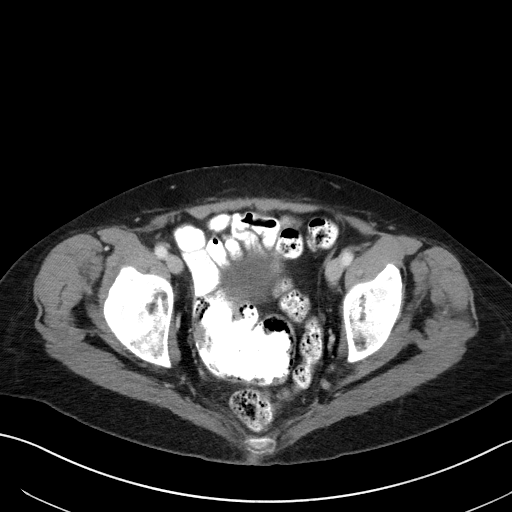
[im 34/125  mediastinal]
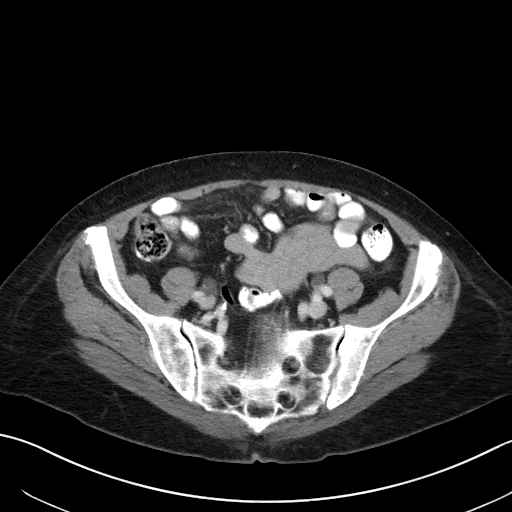
[im 46/125  mediastinal]
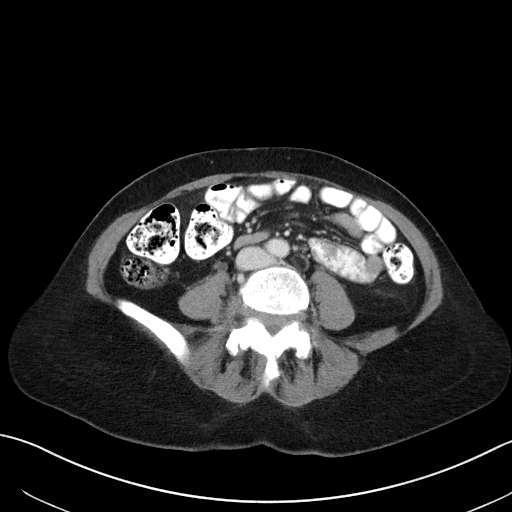
[im 57/125  mediastinal]
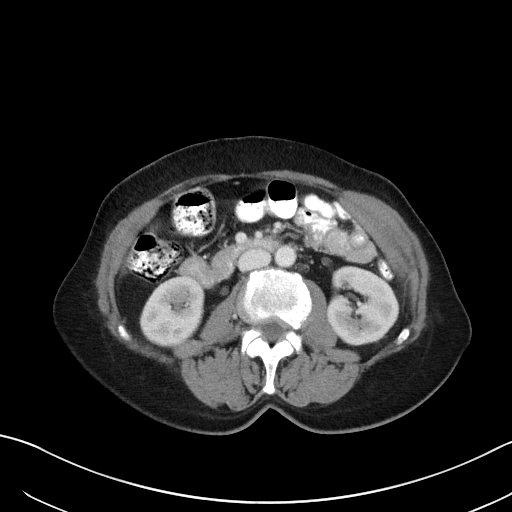
[im 68/125  mediastinal]
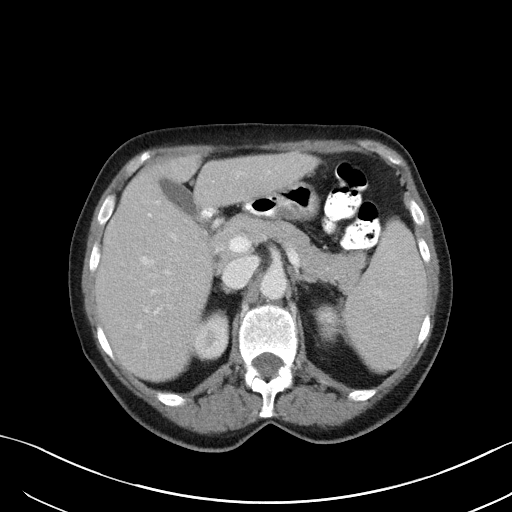
[im 79/125  mediastinal]
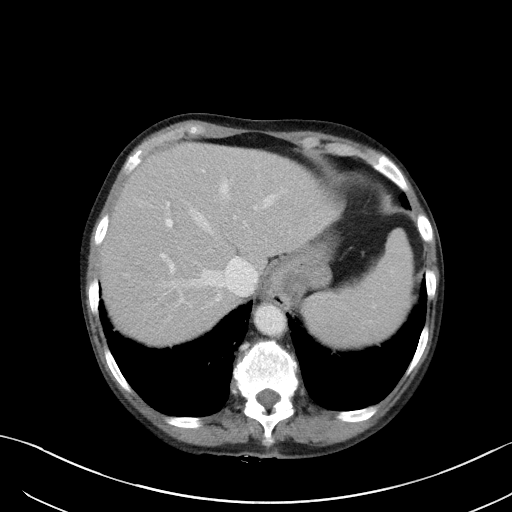
[im 91/125  mediastinal]
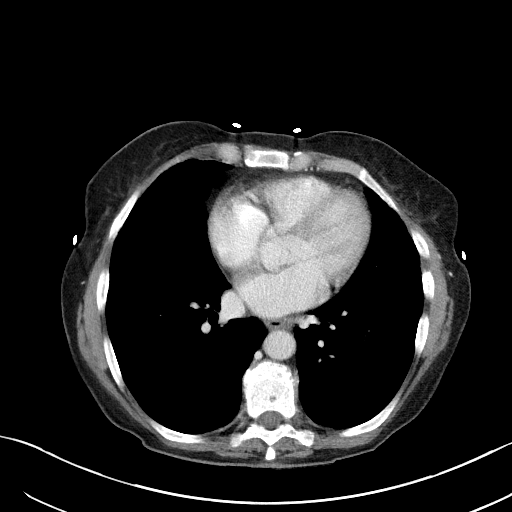
[im 102/125  mediastinal]
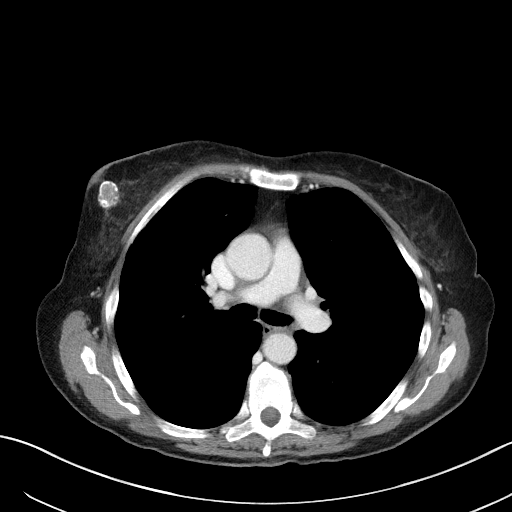
[im 102/125  bone]
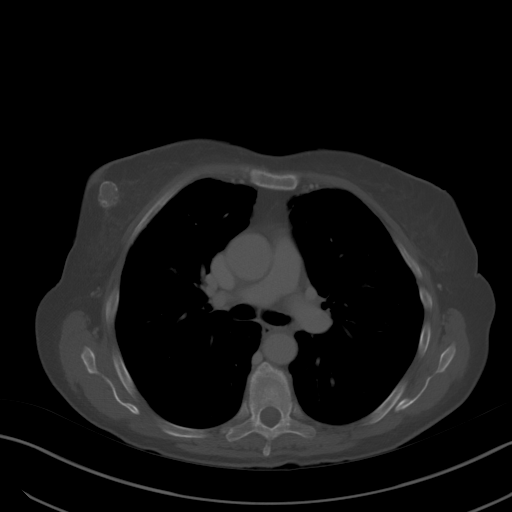
[im 113/125  mediastinal]
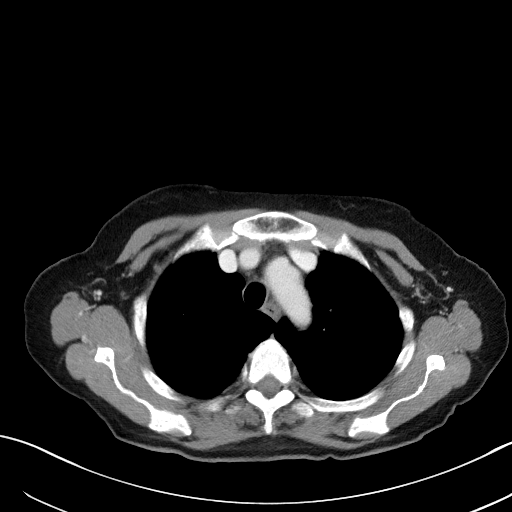

[Series 4: lung · axial · 0.73mm/px · 1 of 146 slices shown]
[im 13/146  bone]
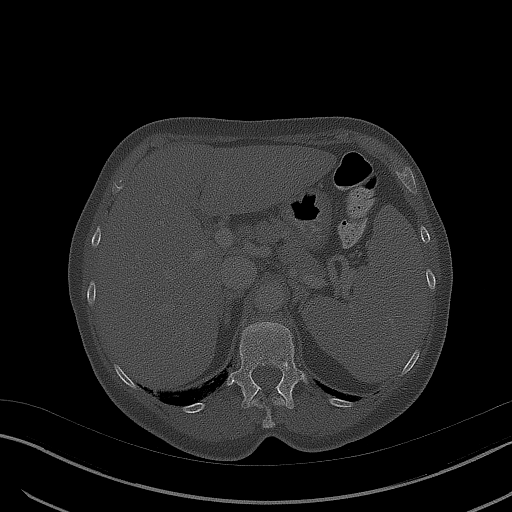

[Series 5: coronals · coronal · 0.73mm/px · 3 of 138 slices shown]
[im 28/138  mediastinal]
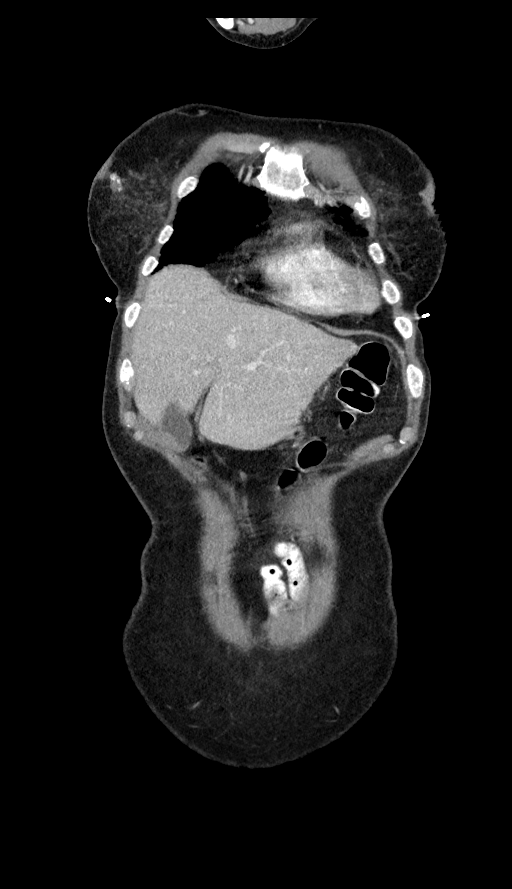
[im 55/138  mediastinal]
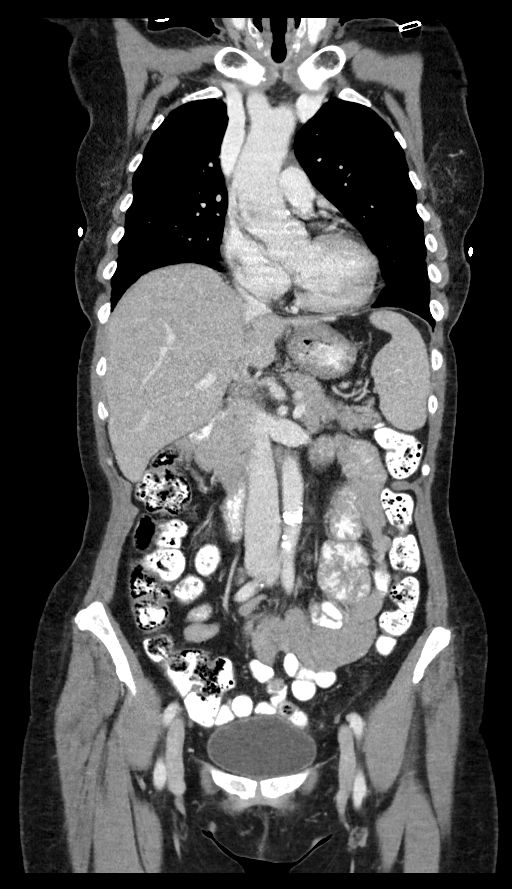
[im 83/138  mediastinal]
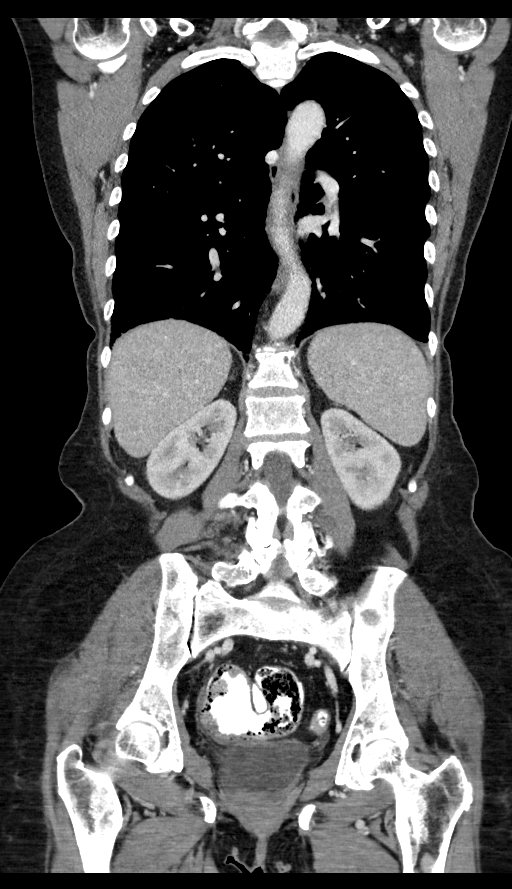

[14 of 36 positions shown; findings below may reference images not displayed]

FINDINGS: CT CHEST FINDINGS

Cardiovascular: No significant vascular findings. Normal heart size.
No pericardial effusion.

Mediastinum/Nodes: Dystrophic calcification in the RIGHT breast. No
axillary adenopathy. No internal mammary adenopathy. No mediastinal
or hilar adenopathy.

Lungs/Pleura: No suspicious pulmonary nodule. Mild peripheral
reticulation in the RIGHT middle lobe and lingula. No
bronchiectasis.

Musculoskeletal: No aggressive osseous lesion.

CT ABDOMEN AND PELVIS FINDINGS

Hepatobiliary: No focal hepatic lesion. No biliary ductal
dilatation. Gallbladder is normal. Common bile duct is normal.

Pancreas: Pancreas is normal. No ductal dilatation. No pancreatic
inflammation.

Spleen: Normal spleen

Adrenals/urinary tract: Adrenal glands and kidneys are normal.
Normal ureters. The bladder dose below the pelvic floor.

Stomach/Bowel: Moderate size hiatal hernia. The stomach, duodenum
and small-bowel normal. Appendix normal. Ascending, transverse, and
descending colon normal.

Vascular/Lymphatic: Abdominal aorta is normal caliber with
atherosclerotic calcification. There is no retroperitoneal or
periportal lymphadenopathy. No pelvic lymphadenopathy.

Reproductive: Post hysterectomy.  Adnexa unremarkable

Other: No free fluid.

Musculoskeletal: No aggressive osseous lesion. Anterolisthesis of L4
on L5 (grade 1)
IMPRESSION: 1. No explanation for weight loss.
2. Mild peripheral reticulation in the RIGHT middle lobe and lingula
suggest chronic inflammation/infection.
3. Moderate size hiatal hernia.
4. Cystocele extends into the the pelvic floor.
5. Aortic Atherosclerosis ([id number]).
